# Patient Record
Sex: Male | Born: 1945
Health system: Southern US, Community
[De-identification: ages and names within clinical notes are randomized; demographics above are authoritative.]

## PROBLEM LIST (undated history)

## (undated) DIAGNOSIS — E119 Type 2 diabetes mellitus without complications: Secondary | ICD-10-CM

## (undated) DIAGNOSIS — I4891 Unspecified atrial fibrillation: Secondary | ICD-10-CM

## (undated) DIAGNOSIS — E785 Hyperlipidemia, unspecified: Secondary | ICD-10-CM

## (undated) DIAGNOSIS — D649 Anemia, unspecified: Secondary | ICD-10-CM

## (undated) DIAGNOSIS — I10 Essential (primary) hypertension: Secondary | ICD-10-CM

## (undated) DIAGNOSIS — E079 Disorder of thyroid, unspecified: Secondary | ICD-10-CM

## (undated) DIAGNOSIS — N529 Male erectile dysfunction, unspecified: Secondary | ICD-10-CM

## (undated) DIAGNOSIS — T300 Burn of unspecified body region, unspecified degree: Secondary | ICD-10-CM

## (undated) DIAGNOSIS — H3530 Unspecified macular degeneration: Secondary | ICD-10-CM

## (undated) HISTORY — DX: Male erectile dysfunction, unspecified: N52.9

## (undated) HISTORY — DX: Unspecified atrial fibrillation: I48.91

## (undated) HISTORY — DX: Anemia, unspecified: D64.9

## (undated) HISTORY — DX: Type 2 diabetes mellitus without complications: E11.9

## (undated) HISTORY — DX: Essential (primary) hypertension: I10

## (undated) HISTORY — DX: Disorder of thyroid, unspecified: E07.9

## (undated) HISTORY — DX: Burn of unspecified body region, unspecified degree: T30.0

## (undated) HISTORY — DX: Unspecified macular degeneration: H35.30

## (undated) HISTORY — DX: Hyperlipidemia, unspecified: E78.5

---
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
## 1972-05-29 HISTORY — PX: FOOT TUMOR EXCISION: SUR566

## 1973-05-29 HISTORY — PX: OTHER SURGICAL HISTORY: SHX169

## 2000-08-20 ENCOUNTER — Encounter: Admission: RE | Admit: 2000-08-20 | Discharge: 2000-11-18 | Payer: Self-pay | Admitting: Gastroenterology

## 2002-08-22 ENCOUNTER — Ambulatory Visit (HOSPITAL_COMMUNITY): Admission: RE | Admit: 2002-08-22 | Discharge: 2002-08-22 | Payer: Self-pay | Admitting: Gastroenterology

## 2012-03-29 DIAGNOSIS — E785 Hyperlipidemia, unspecified: Secondary | ICD-10-CM

## 2012-03-29 HISTORY — DX: Hyperlipidemia, unspecified: E78.5

## 2012-07-20 ENCOUNTER — Encounter: Payer: Self-pay | Admitting: *Deleted

## 2012-07-20 DIAGNOSIS — E039 Hypothyroidism, unspecified: Secondary | ICD-10-CM | POA: Insufficient documentation

## 2012-07-20 DIAGNOSIS — N529 Male erectile dysfunction, unspecified: Secondary | ICD-10-CM | POA: Insufficient documentation

## 2012-07-20 DIAGNOSIS — E1139 Type 2 diabetes mellitus with other diabetic ophthalmic complication: Secondary | ICD-10-CM | POA: Insufficient documentation

## 2012-07-20 DIAGNOSIS — E079 Disorder of thyroid, unspecified: Secondary | ICD-10-CM | POA: Insufficient documentation

## 2012-07-20 DIAGNOSIS — D649 Anemia, unspecified: Secondary | ICD-10-CM | POA: Insufficient documentation

## 2012-07-20 DIAGNOSIS — I10 Essential (primary) hypertension: Secondary | ICD-10-CM | POA: Insufficient documentation

## 2012-07-20 DIAGNOSIS — E782 Mixed hyperlipidemia: Secondary | ICD-10-CM | POA: Insufficient documentation

## 2012-08-16 ENCOUNTER — Other Ambulatory Visit: Payer: Self-pay | Admitting: Physician Assistant

## 2012-08-16 DIAGNOSIS — G894 Chronic pain syndrome: Secondary | ICD-10-CM

## 2012-08-16 NOTE — Telephone Encounter (Signed)
This is copy. I already addressed.

## 2012-08-16 NOTE — Telephone Encounter (Signed)
Approved. # 90 / 0 refill

## 2012-08-16 NOTE — Telephone Encounter (Signed)
Please call in medicine and call patient.

## 2012-08-16 NOTE — Telephone Encounter (Signed)
Need approval for controlled medication.

## 2012-08-19 NOTE — Telephone Encounter (Signed)
vicodins called in per MBD to pharmacy

## 2012-08-23 LAB — HM DIABETES EYE EXAM

## 2012-08-27 DIAGNOSIS — H3530 Unspecified macular degeneration: Secondary | ICD-10-CM

## 2012-08-27 HISTORY — DX: Unspecified macular degeneration: H35.30

## 2012-08-27 HISTORY — PX: EYE SURGERY: SHX253

## 2012-08-28 ENCOUNTER — Encounter: Payer: Self-pay | Admitting: Family Medicine

## 2012-08-28 DIAGNOSIS — H3530 Unspecified macular degeneration: Secondary | ICD-10-CM | POA: Insufficient documentation

## 2012-10-18 ENCOUNTER — Encounter: Payer: Self-pay | Admitting: Physician Assistant

## 2012-10-18 ENCOUNTER — Ambulatory Visit (INDEPENDENT_AMBULATORY_CARE_PROVIDER_SITE_OTHER): Payer: Medicare Other | Admitting: Physician Assistant

## 2012-10-18 ENCOUNTER — Other Ambulatory Visit: Payer: Self-pay | Admitting: Physician Assistant

## 2012-10-18 VITALS — BP 138/80 | HR 70 | Temp 97.8°F | Resp 20 | Ht 67.5 in | Wt 209.0 lb

## 2012-10-18 DIAGNOSIS — E1149 Type 2 diabetes mellitus with other diabetic neurological complication: Secondary | ICD-10-CM

## 2012-10-18 DIAGNOSIS — E079 Disorder of thyroid, unspecified: Secondary | ICD-10-CM

## 2012-10-18 DIAGNOSIS — E119 Type 2 diabetes mellitus without complications: Secondary | ICD-10-CM

## 2012-10-18 DIAGNOSIS — E1139 Type 2 diabetes mellitus with other diabetic ophthalmic complication: Secondary | ICD-10-CM

## 2012-10-18 DIAGNOSIS — E1142 Type 2 diabetes mellitus with diabetic polyneuropathy: Secondary | ICD-10-CM

## 2012-10-18 DIAGNOSIS — Z794 Long term (current) use of insulin: Secondary | ICD-10-CM

## 2012-10-18 DIAGNOSIS — I10 Essential (primary) hypertension: Secondary | ICD-10-CM

## 2012-10-18 DIAGNOSIS — E1165 Type 2 diabetes mellitus with hyperglycemia: Secondary | ICD-10-CM | POA: Insufficient documentation

## 2012-10-18 DIAGNOSIS — E785 Hyperlipidemia, unspecified: Secondary | ICD-10-CM

## 2012-10-18 DIAGNOSIS — E1140 Type 2 diabetes mellitus with diabetic neuropathy, unspecified: Secondary | ICD-10-CM

## 2012-10-18 DIAGNOSIS — D649 Anemia, unspecified: Secondary | ICD-10-CM

## 2012-10-18 DIAGNOSIS — H3530 Unspecified macular degeneration: Secondary | ICD-10-CM

## 2012-10-18 DIAGNOSIS — N529 Male erectile dysfunction, unspecified: Secondary | ICD-10-CM

## 2012-10-18 LAB — HEMOGLOBIN A1C, FINGERSTICK: Hgb A1C (fingerstick): 7.9 % — ABNORMAL HIGH (ref <5.7)

## 2012-10-18 NOTE — Telephone Encounter (Signed)
Med rf °

## 2012-10-18 NOTE — Telephone Encounter (Signed)
?   OK to Refill  
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
## 2012-10-18 NOTE — Telephone Encounter (Signed)
Approved # 90/ 0 refill

## 2012-10-19 ENCOUNTER — Other Ambulatory Visit: Payer: Self-pay | Admitting: Physician Assistant

## 2012-10-22 ENCOUNTER — Other Ambulatory Visit: Payer: Self-pay | Admitting: Physician Assistant

## 2012-10-22 ENCOUNTER — Encounter: Payer: Self-pay | Admitting: Physician Assistant

## 2012-10-22 NOTE — Telephone Encounter (Signed)
Hydrocodone was refilled on 10/18/12.  Pharmacy was called and refill verified.

## 2012-10-23 NOTE — Telephone Encounter (Signed)
Med was just refilled Friday 5/28.  That was verified by call to pharmacy. Pt has not picked up yet.  New request denied.

## 2012-11-26 ENCOUNTER — Other Ambulatory Visit: Payer: Self-pay | Admitting: Physician Assistant

## 2012-11-27 NOTE — Telephone Encounter (Signed)
?  ok to refill °

## 2012-11-27 NOTE — Telephone Encounter (Signed)
Approved. #90+ 0. 

## 2012-11-27 NOTE — Telephone Encounter (Signed)
Med phoned in °

## 2012-12-28 ENCOUNTER — Other Ambulatory Visit: Payer: Self-pay | Admitting: Physician Assistant

## 2012-12-30 ENCOUNTER — Other Ambulatory Visit: Payer: Self-pay | Admitting: Physician Assistant

## 2012-12-30 NOTE — Telephone Encounter (Signed)
Last refill 11/26/12.  Last OV 10/18/12  Need approval for controlled medication.

## 2012-12-30 NOTE — Telephone Encounter (Signed)
Rx called in 

## 2012-12-30 NOTE — Telephone Encounter (Signed)
Approved.  

## 2012-12-31 NOTE — Telephone Encounter (Addendum)
Duplicate request.  Denied.  Called in yesterday.  Verified with pharmacy

## 2013-01-17 ENCOUNTER — Other Ambulatory Visit: Payer: Self-pay | Admitting: Family Medicine

## 2013-01-17 NOTE — Telephone Encounter (Signed)
Medication refilled per protocol. 

## 2013-01-23 ENCOUNTER — Telehealth: Payer: Self-pay | Admitting: Physician Assistant

## 2013-01-23 ENCOUNTER — Ambulatory Visit: Payer: Medicare Other | Admitting: Physician Assistant

## 2013-01-23 ENCOUNTER — Other Ambulatory Visit: Payer: Self-pay | Admitting: Family Medicine

## 2013-01-23 ENCOUNTER — Encounter: Payer: Self-pay | Admitting: Family Medicine

## 2013-01-23 MED ORDER — METFORMIN HCL 500 MG PO TABS: 500.0000 mg | ORAL_TABLET | Freq: Once | ORAL | Status: DC

## 2013-01-23 MED ORDER — LISINOPRIL 10 MG PO TABS: 10.0000 mg | ORAL_TABLET | Freq: Every day | ORAL | Status: DC

## 2013-01-23 MED ORDER — LEVOTHYROXINE SODIUM 125 MCG PO TABS: 125.0000 ug | ORAL_TABLET | Freq: Every day | ORAL | Status: DC

## 2013-01-23 NOTE — Telephone Encounter (Signed)
Pt NS for appt today.  Medication refill for one time only.  Patient needs to be seen.  Letter sent for patient to call and schedule

## 2013-01-23 NOTE — Telephone Encounter (Signed)
Lisinopril 10 mg 1 QD #90 Synthroid 125 mcg 1 QD #90 Metoformin 500 mg 1 QD #90

## 2013-01-23 NOTE — Telephone Encounter (Signed)
Medication refill for one time only.  Patient needs to be seen.  Letter sent for patient to call and schedule 

## 2013-01-24 ENCOUNTER — Ambulatory Visit: Payer: Medicare Other | Admitting: Physician Assistant

## 2013-01-28 ENCOUNTER — Telehealth: Payer: Self-pay | Admitting: Physician Assistant

## 2013-01-28 ENCOUNTER — Other Ambulatory Visit: Payer: Self-pay | Admitting: Physician Assistant

## 2013-01-28 NOTE — Telephone Encounter (Signed)
Lisinopril Synthroid

## 2013-01-28 NOTE — Telephone Encounter (Signed)
Pt NTBS.  meds just refilled last week for one month.

## 2013-01-28 NOTE — Telephone Encounter (Signed)
Last refill 12/28/12/#90.  Last office visit 10/18/12  OK refill?

## 2013-01-28 NOTE — Telephone Encounter (Signed)
Refills done last week.  Pt needs to be seen.  Letter has been sent.

## 2013-01-29 NOTE — Telephone Encounter (Signed)
rx called in

## 2013-01-29 NOTE — Telephone Encounter (Signed)
Approved. #90+ 0. 

## 2013-03-07 ENCOUNTER — Telehealth: Payer: Self-pay | Admitting: *Deleted

## 2013-03-07 NOTE — Telephone Encounter (Signed)
RECEIVED FAX REQUEST FROM [facility]  FOR GABAPENTIN

## 2013-03-12 MED ORDER — GABAPENTIN 400 MG PO CAPS: 400.0000 mg | ORAL_CAPSULE | Freq: Two times a day (BID) | ORAL | Status: DC

## 2013-03-12 NOTE — Telephone Encounter (Signed)
Prescription can be refilled

## 2013-03-13 NOTE — Telephone Encounter (Signed)
Please refill.

## 2013-03-28 ENCOUNTER — Telehealth: Payer: Self-pay | Admitting: *Deleted

## 2013-03-28 NOTE — Telephone Encounter (Signed)
PT CALLED WANTING TO KNOW WHO SENT IN THE GABAPENTIN TO [facility]. TOLD PT FAX REQUEST CAME THRU AND DR REGAL APPROVED IT. PT STATES THAT WAS NOT SUPPOSED TO HAPPEN AND WILL CALL HIS PHARMACY/[facility] TO GET IT STRAIGHTEN OUT.

## 2013-04-21 ENCOUNTER — Ambulatory Visit (INDEPENDENT_AMBULATORY_CARE_PROVIDER_SITE_OTHER): Payer: Medicare Other | Admitting: Physician Assistant

## 2013-04-21 ENCOUNTER — Encounter: Payer: Self-pay | Admitting: Physician Assistant

## 2013-04-21 ENCOUNTER — Other Ambulatory Visit: Payer: Self-pay | Admitting: Family Medicine

## 2013-04-21 VITALS — BP 164/90 | HR 84 | Temp 98.7°F | Resp 18 | Wt 156.0 lb

## 2013-04-21 DIAGNOSIS — I10 Essential (primary) hypertension: Secondary | ICD-10-CM

## 2013-04-21 DIAGNOSIS — G894 Chronic pain syndrome: Secondary | ICD-10-CM

## 2013-04-21 DIAGNOSIS — E1142 Type 2 diabetes mellitus with diabetic polyneuropathy: Secondary | ICD-10-CM

## 2013-04-21 DIAGNOSIS — H3530 Unspecified macular degeneration: Secondary | ICD-10-CM

## 2013-04-21 DIAGNOSIS — E1140 Type 2 diabetes mellitus with diabetic neuropathy, unspecified: Secondary | ICD-10-CM

## 2013-04-21 DIAGNOSIS — E1139 Type 2 diabetes mellitus with other diabetic ophthalmic complication: Secondary | ICD-10-CM

## 2013-04-21 DIAGNOSIS — E079 Disorder of thyroid, unspecified: Secondary | ICD-10-CM

## 2013-04-21 DIAGNOSIS — N529 Male erectile dysfunction, unspecified: Secondary | ICD-10-CM

## 2013-04-21 DIAGNOSIS — D649 Anemia, unspecified: Secondary | ICD-10-CM

## 2013-04-21 DIAGNOSIS — E1149 Type 2 diabetes mellitus with other diabetic neurological complication: Secondary | ICD-10-CM

## 2013-04-21 DIAGNOSIS — Z794 Long term (current) use of insulin: Secondary | ICD-10-CM

## 2013-04-21 DIAGNOSIS — E785 Hyperlipidemia, unspecified: Secondary | ICD-10-CM

## 2013-04-21 DIAGNOSIS — E119 Type 2 diabetes mellitus without complications: Secondary | ICD-10-CM

## 2013-04-21 LAB — CBC WITH DIFFERENTIAL/PLATELET
Basophils Absolute: 0 10*3/uL (ref 0.0–0.1)
Eosinophils Relative: 2 % (ref 0–5)
Hemoglobin: 13.7 g/dL (ref 13.0–17.0)
Lymphocytes Relative: 14 % (ref 12–46)
Lymphs Abs: 1.5 10*3/uL (ref 0.7–4.0)
MCH: 30.9 pg (ref 26.0–34.0)
MCV: 90.5 fL (ref 78.0–100.0)
Monocytes Relative: 10 % (ref 3–12)
Neutrophils Relative %: 74 % (ref 43–77)
Platelets: 257 10*3/uL (ref 150–400)
RBC: 4.44 MIL/uL (ref 4.22–5.81)
WBC: 10.4 10*3/uL (ref 4.0–10.5)

## 2013-04-21 LAB — HEMOGLOBIN A1C
Hgb A1c MFr Bld: 8.9 % — ABNORMAL HIGH (ref <5.7)
Mean Plasma Glucose: 209 mg/dL — ABNORMAL HIGH (ref <117)

## 2013-04-21 MED ORDER — METFORMIN HCL 500 MG PO TABS: 500.0000 mg | ORAL_TABLET | Freq: Two times a day (BID) | ORAL | Status: DC

## 2013-04-21 MED ORDER — SIMVASTATIN 10 MG PO TABS: 10.0000 mg | ORAL_TABLET | Freq: Every day | ORAL | Status: DC

## 2013-04-21 MED ORDER — INSULIN ASPART 100 UNIT/ML ~~LOC~~ SOLN: 5.0000 [IU] | Freq: Three times a day (TID) | SUBCUTANEOUS | Status: DC

## 2013-04-21 MED ORDER — LEVOTHYROXINE SODIUM 125 MCG PO TABS: 125.0000 ug | ORAL_TABLET | Freq: Every day | ORAL | Status: DC

## 2013-04-21 MED ORDER — LISINOPRIL 10 MG PO TABS: 10.0000 mg | ORAL_TABLET | Freq: Every day | ORAL | Status: DC

## 2013-04-21 MED ORDER — HYDROCODONE-ACETAMINOPHEN 5-325 MG PO TABS: ORAL_TABLET | ORAL | Status: DC

## 2013-04-21 MED ORDER — GABAPENTIN 400 MG PO CAPS: 400.0000 mg | ORAL_CAPSULE | Freq: Two times a day (BID) | ORAL | Status: DC

## 2013-04-21 MED ORDER — INSULIN GLARGINE 100 UNIT/ML ~~LOC~~ SOLN: 25.0000 [IU] | Freq: Every day | SUBCUTANEOUS | Status: DC

## 2013-04-21 NOTE — Telephone Encounter (Signed)
90 day refills to mail order
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
## 2013-04-22 LAB — COMPLETE METABOLIC PANEL WITH GFR
ALT: 19 U/L (ref 0–53)
Albumin: 4.6 g/dL (ref 3.5–5.2)
BUN: 13 mg/dL (ref 6–23)
CO2: 25 mEq/L (ref 19–32)
Calcium: 9.9 mg/dL (ref 8.4–10.5)
Chloride: 99 mEq/L (ref 96–112)
Creat: 1.02 mg/dL (ref 0.50–1.35)
GFR, Est African American: 88 mL/min
Potassium: 4.2 mEq/L (ref 3.5–5.3)
Sodium: 136 mEq/L (ref 135–145)
Total Protein: 7.3 g/dL (ref 6.0–8.3)

## 2013-04-22 LAB — TSH: TSH: 3.565 u[IU]/mL (ref 0.350–4.500)

## 2013-04-22 LAB — LIPID PANEL: LDL Cholesterol: 69 mg/dL (ref 0–99)

## 2013-04-25 ENCOUNTER — Telehealth: Payer: Self-pay | Admitting: Family Medicine

## 2013-04-25 NOTE — Telephone Encounter (Signed)
Pt aware of labs and provider recommendations.   Also want to have his routine PSA done.  Lab notified to add to labs drawn.

## 2013-04-25 NOTE — Telephone Encounter (Signed)
PSA ws done in February.  Was normal then.  Pt made aware.

## 2013-04-25 NOTE — Telephone Encounter (Signed)
Message copied by Olena Mater on Fri Apr 25, 2013  2:47 PM ------      Message from: [name]      Created: Tue Apr 22, 2013  9:45 AM       Tell patient that A1c is high at 8.9.      He reports that he checks his blood sugar 4 or 5 times per day and adjust his insulin dose as needed.      Tell Him that the remainder of his labs are great.      Continue all current medications the same. Just try to increase his insulin to better control his sugar. ------

## 2013-05-02 ENCOUNTER — Ambulatory Visit (INDEPENDENT_AMBULATORY_CARE_PROVIDER_SITE_OTHER): Payer: Medicare Other | Admitting: Podiatry

## 2013-05-02 ENCOUNTER — Encounter: Payer: Self-pay | Admitting: Podiatry

## 2013-05-02 VITALS — BP 138/72 | HR 70 | Resp 16

## 2013-05-02 DIAGNOSIS — M7750 Other enthesopathy of unspecified foot: Secondary | ICD-10-CM

## 2013-05-02 MED ORDER — TRIAMCINOLONE ACETONIDE 10 MG/ML IJ SUSP
10.0000 mg | Freq: Once | INTRAMUSCULAR | Status: AC
  Administered 2013-05-02: 10 mg

## 2013-05-03 NOTE — Progress Notes (Signed)
Subjective:     Patient ID: Zachary Reed, male   DOB: 08-01-1945, 67 y.o.   MRN: [id number]  HPI patient points to the top of both feet stated they are becoming painful again. States oral medicine is not helping him   Review of Systems     Objective:   Physical Exam Tendinitis dorsal left foot with midtarsal joint arthritis and inflammation pressed    Assessment:     Explained tendinitis to the patient    Plan:     Injected the dorsal midtarsal joint both feet 3 mg Kenalog 5 mg Xylocaine Marcaine mixture and applied sterile dressing

## 2013-05-15 ENCOUNTER — Other Ambulatory Visit: Payer: Self-pay | Admitting: Physician Assistant

## 2013-05-15 NOTE — Telephone Encounter (Signed)
Medication refilled per protocol. 

## 2013-05-26 ENCOUNTER — Telehealth: Payer: Self-pay | Admitting: Physician Assistant

## 2013-05-26 DIAGNOSIS — G894 Chronic pain syndrome: Secondary | ICD-10-CM

## 2013-05-26 MED ORDER — HYDROCODONE-ACETAMINOPHEN 5-325 MG PO TABS: 1.0000 | ORAL_TABLET | Freq: Four times a day (QID) | ORAL | Status: DC | PRN

## 2013-05-26 MED ORDER — HYDROCODONE-ACETAMINOPHEN 5-325 MG PO TABS: ORAL_TABLET | ORAL | Status: DC

## 2013-05-26 NOTE — Telephone Encounter (Signed)
Approved. For 3 prescriptions. Each for #90 with 0 refills.  One to  fill now,  1 for "do not fill until 06/26/2013" 1 for " do not fill until 2/29/2015"

## 2013-05-26 NOTE — Telephone Encounter (Signed)
Last Rf 11/24  #90  Last OV 11/24  OK refill?

## 2013-05-26 NOTE — Telephone Encounter (Signed)
Rx's printed for provider signature.  Pt aware ready for pick up

## 2013-05-26 NOTE — Telephone Encounter (Signed)
Needs Rx for Hydrocodone #90

## 2013-06-10 ENCOUNTER — Telehealth: Payer: Self-pay | Admitting: Family Medicine

## 2013-06-10 DIAGNOSIS — I10 Essential (primary) hypertension: Secondary | ICD-10-CM

## 2013-06-10 MED ORDER — LISINOPRIL 10 MG PO TABS: 10.0000 mg | ORAL_TABLET | Freq: Every day | ORAL | Status: DC

## 2013-06-10 NOTE — Telephone Encounter (Signed)
Medication refilled per protocol. 

## 2013-06-11 ENCOUNTER — Other Ambulatory Visit: Payer: Self-pay | Admitting: Family Medicine

## 2013-06-11 MED ORDER — GLUCOSE BLOOD VI STRP: 1.0000 | ORAL_STRIP | Freq: Three times a day (TID) | Status: DC

## 2013-06-11 NOTE — Telephone Encounter (Signed)
Test strips to [facility] has new insurance

## 2013-07-23 ENCOUNTER — Ambulatory Visit (INDEPENDENT_AMBULATORY_CARE_PROVIDER_SITE_OTHER): Payer: Medicare HMO | Admitting: Physician Assistant

## 2013-07-23 ENCOUNTER — Encounter: Payer: Self-pay | Admitting: Physician Assistant

## 2013-07-23 VITALS — BP 114/56 | HR 68 | Temp 97.9°F | Resp 18 | Ht 68.0 in | Wt 159.0 lb

## 2013-07-23 DIAGNOSIS — E785 Hyperlipidemia, unspecified: Secondary | ICD-10-CM

## 2013-07-23 DIAGNOSIS — D649 Anemia, unspecified: Secondary | ICD-10-CM

## 2013-07-23 DIAGNOSIS — E1139 Type 2 diabetes mellitus with other diabetic ophthalmic complication: Secondary | ICD-10-CM

## 2013-07-23 DIAGNOSIS — N529 Male erectile dysfunction, unspecified: Secondary | ICD-10-CM

## 2013-07-23 DIAGNOSIS — E1149 Type 2 diabetes mellitus with other diabetic neurological complication: Secondary | ICD-10-CM

## 2013-07-23 DIAGNOSIS — E079 Disorder of thyroid, unspecified: Secondary | ICD-10-CM

## 2013-07-23 DIAGNOSIS — E1140 Type 2 diabetes mellitus with diabetic neuropathy, unspecified: Secondary | ICD-10-CM

## 2013-07-23 DIAGNOSIS — I10 Essential (primary) hypertension: Secondary | ICD-10-CM

## 2013-07-23 DIAGNOSIS — E1142 Type 2 diabetes mellitus with diabetic polyneuropathy: Secondary | ICD-10-CM

## 2013-07-23 DIAGNOSIS — E119 Type 2 diabetes mellitus without complications: Secondary | ICD-10-CM

## 2013-07-23 DIAGNOSIS — H3530 Unspecified macular degeneration: Secondary | ICD-10-CM

## 2013-07-23 DIAGNOSIS — Z794 Long term (current) use of insulin: Secondary | ICD-10-CM

## 2013-07-23 DIAGNOSIS — Z125 Encounter for screening for malignant neoplasm of prostate: Secondary | ICD-10-CM

## 2013-07-23 LAB — PSA, MEDICARE: PSA: 0.89 ng/mL (ref <=4.00)

## 2013-07-23 LAB — HEMOGLOBIN A1C
HEMOGLOBIN A1C: 8.3 % — AB (ref <5.7)
MEAN PLASMA GLUCOSE: 192 mg/dL — AB (ref <117)

## 2013-07-23 MED ORDER — GABAPENTIN 600 MG PO TABS: 600.0000 mg | ORAL_TABLET | Freq: Three times a day (TID) | ORAL | Status: DC

## 2013-07-24 LAB — MICROALBUMIN, URINE: Microalb, Ur: 0.5 mg/dL (ref 0.00–1.89)
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
## 2013-07-28 ENCOUNTER — Telehealth: Payer: Self-pay | Admitting: Family Medicine

## 2013-07-28 DIAGNOSIS — Z0100 Encounter for examination of eyes and vision without abnormal findings: Secondary | ICD-10-CM

## 2013-07-28 DIAGNOSIS — E119 Type 2 diabetes mellitus without complications: Secondary | ICD-10-CM

## 2013-07-28 NOTE — Telephone Encounter (Signed)
Message copied by Olena Mater on Mon Jul 28, 2013  1:41 PM ------      Message from: [name]      Created: Thu Jul 24, 2013  8:35 AM       A1C is still too high. He is on insulin. Including mealtime insulin. He just needs to do a better job of giving appropriate amount of insulin to keep the sugar controlled.      Other labs look good. ------

## 2013-08-01 LAB — HM DIABETES EYE EXAM

## 2013-08-01 NOTE — Telephone Encounter (Signed)
Spoke to patient about lab work. Reinforced need to be sure he is taking his insulin correctly every day.  Has 3 mth appt already made.  States has seen Eye doctor for eye exam but they told him to get referral and send to them.

## 2013-08-13 ENCOUNTER — Telehealth: Payer: Self-pay | Admitting: Physician Assistant

## 2013-08-13 DIAGNOSIS — E119 Type 2 diabetes mellitus without complications: Secondary | ICD-10-CM

## 2013-08-13 DIAGNOSIS — E079 Disorder of thyroid, unspecified: Secondary | ICD-10-CM

## 2013-08-13 DIAGNOSIS — G894 Chronic pain syndrome: Secondary | ICD-10-CM

## 2013-08-13 DIAGNOSIS — I10 Essential (primary) hypertension: Secondary | ICD-10-CM

## 2013-08-13 NOTE — Telephone Encounter (Signed)
Call back number is [phone] [facility]  Pt is needing  lisinopril (PRINIVIL,ZESTRIL) 10 MG tablet levothyroxine (SYNTHROID, LEVOTHROID) 125 MCG tablet metFORMIN (GLUCOPHAGE) 500 MG tablet 90 day supply on the 3 above  He also believes he needs a refill on HYDROcodone-acetaminophen (NORCO/VICODIN) 5-325 MG per tablet He is not sure if there are refills left at [facility]

## 2013-08-15 MED ORDER — LISINOPRIL 10 MG PO TABS: 10.0000 mg | ORAL_TABLET | Freq: Every day | ORAL | Status: DC

## 2013-08-15 MED ORDER — HYDROCODONE-ACETAMINOPHEN 5-325 MG PO TABS: 1.0000 | ORAL_TABLET | Freq: Four times a day (QID) | ORAL | Status: DC | PRN

## 2013-08-15 MED ORDER — LEVOTHYROXINE SODIUM 125 MCG PO TABS: 125.0000 ug | ORAL_TABLET | Freq: Every day | ORAL | Status: DC

## 2013-08-15 MED ORDER — METFORMIN HCL 500 MG PO TABS: 500.0000 mg | ORAL_TABLET | Freq: Two times a day (BID) | ORAL | Status: DC

## 2013-08-15 NOTE — Telephone Encounter (Signed)
Last Refill Hydrocodone 05/26/13.  Last OV 07/23/13.  Refill appropriate.  Rx printed for provider signature.  #90 + no refills

## 2013-09-03 ENCOUNTER — Encounter: Payer: Self-pay | Admitting: Family Medicine

## 2013-09-05 ENCOUNTER — Ambulatory Visit: Payer: Medicare Other | Admitting: Podiatry

## 2013-09-12 ENCOUNTER — Ambulatory Visit (INDEPENDENT_AMBULATORY_CARE_PROVIDER_SITE_OTHER): Payer: Medicare HMO | Admitting: Podiatry

## 2013-09-12 VITALS — Resp 16 | Ht 68.0 in | Wt 160.0 lb

## 2013-09-12 DIAGNOSIS — M779 Enthesopathy, unspecified: Secondary | ICD-10-CM

## 2013-09-12 MED ORDER — TRIAMCINOLONE ACETONIDE 10 MG/ML IJ SUSP
10.0000 mg | Freq: Once | INTRAMUSCULAR | Status: AC
  Administered 2013-09-12: 10 mg

## 2013-09-14 NOTE — Progress Notes (Signed)
Subjective:     Patient ID: Zachary Reed, male   DOB: Oct 07, 1945, 68 y.o.   MRN: [id number]  HPI patient presents stating I did okay for a few months but my pain has started to intensified over the last 4-6 weeks in my ankles   Review of Systems     Objective:   Physical Exam Neurovascular status intact with pain in the ankle region of both feet with reduced range of motion noted    Assessment:     Chronic arthritis with inflammatory capsulitis of the sinus tarsi both ankles    Plan:     Sterile preps done injected the ankles with 3 mg Kenalog 5 mg Xylocaine Marcaine mixture and instructed that this can be done on a periodic basis. Reappoint her recheck as needed

## 2013-09-23 ENCOUNTER — Telehealth: Payer: Self-pay | Admitting: Physician Assistant

## 2013-09-23 DIAGNOSIS — G894 Chronic pain syndrome: Secondary | ICD-10-CM

## 2013-09-23 NOTE — Telephone Encounter (Signed)
Approved. See if pt can keep up with future Rxes. If so, can go ahead and print 3 sepearte Rxes, each for # 90 + 0 for him to have for 4/28, 5/28, 6/28. He is on this chronic/long term.

## 2013-09-23 NOTE — Telephone Encounter (Signed)
Last RF 08/15/13 #90  Last OV 07/23/13  OK refill?

## 2013-09-23 NOTE — Telephone Encounter (Signed)
Patient would like rx for his norco please call him when ready at [phone]

## 2013-09-24 NOTE — Telephone Encounter (Signed)
Have left pt message to call back to see if he wants a one or three month refill.  If takes three month refill we will not replace if he looses

## 2013-09-25 MED ORDER — HYDROCODONE-ACETAMINOPHEN 5-325 MG PO TABS: 1.0000 | ORAL_TABLET | Freq: Four times a day (QID) | ORAL | Status: DC | PRN

## 2013-09-25 NOTE — Telephone Encounter (Signed)
Pt would like three month refills.  He said his pharmacy will hold.  Patient told once given three month refill presciptions, they WILL NOT be replaced if lost.

## 2013-09-25 NOTE — Telephone Encounter (Signed)
3 Rxes given for 4/30, 5/30, 11/25/13.

## 2013-10-23 ENCOUNTER — Encounter: Payer: Self-pay | Admitting: Physician Assistant

## 2013-10-23 ENCOUNTER — Ambulatory Visit (INDEPENDENT_AMBULATORY_CARE_PROVIDER_SITE_OTHER): Payer: Medicare HMO | Admitting: Physician Assistant

## 2013-10-23 VITALS — BP 126/76 | HR 76 | Temp 98.8°F | Resp 18 | Wt 155.0 lb

## 2013-10-23 DIAGNOSIS — Z794 Long term (current) use of insulin: Secondary | ICD-10-CM

## 2013-10-23 DIAGNOSIS — H3530 Unspecified macular degeneration: Secondary | ICD-10-CM

## 2013-10-23 DIAGNOSIS — E119 Type 2 diabetes mellitus without complications: Secondary | ICD-10-CM

## 2013-10-23 DIAGNOSIS — E1142 Type 2 diabetes mellitus with diabetic polyneuropathy: Secondary | ICD-10-CM

## 2013-10-23 DIAGNOSIS — E079 Disorder of thyroid, unspecified: Secondary | ICD-10-CM

## 2013-10-23 DIAGNOSIS — E1140 Type 2 diabetes mellitus with diabetic neuropathy, unspecified: Secondary | ICD-10-CM

## 2013-10-23 DIAGNOSIS — D649 Anemia, unspecified: Secondary | ICD-10-CM

## 2013-10-23 DIAGNOSIS — E785 Hyperlipidemia, unspecified: Secondary | ICD-10-CM

## 2013-10-23 DIAGNOSIS — G47419 Narcolepsy without cataplexy: Secondary | ICD-10-CM

## 2013-10-23 DIAGNOSIS — E1139 Type 2 diabetes mellitus with other diabetic ophthalmic complication: Secondary | ICD-10-CM

## 2013-10-23 DIAGNOSIS — I10 Essential (primary) hypertension: Secondary | ICD-10-CM

## 2013-10-23 DIAGNOSIS — E1149 Type 2 diabetes mellitus with other diabetic neurological complication: Secondary | ICD-10-CM

## 2013-10-23 DIAGNOSIS — N529 Male erectile dysfunction, unspecified: Secondary | ICD-10-CM

## 2013-10-23 LAB — COMPLETE METABOLIC PANEL WITH GFR
ALT: 15 U/L (ref 0–53)
AST: 19 U/L (ref 0–37)
Albumin: 4.7 g/dL (ref 3.5–5.2)
Alkaline Phosphatase: 77 U/L (ref 39–117)
BILIRUBIN TOTAL: 0.5 mg/dL (ref 0.2–1.2)
BUN: 18 mg/dL (ref 6–23)
CO2: 26 meq/L (ref 19–32)
CREATININE: 0.99 mg/dL (ref 0.50–1.35)
Calcium: 9.6 mg/dL (ref 8.4–10.5)
Chloride: 103 mEq/L (ref 96–112)
GFR, EST NON AFRICAN AMERICAN: 78 mL/min
GLUCOSE: 143 mg/dL — AB (ref 70–99)
Potassium: 4.6 mEq/L (ref 3.5–5.3)
Sodium: 137 mEq/L (ref 135–145)
Total Protein: 7.1 g/dL (ref 6.0–8.3)

## 2013-10-23 LAB — LIPID PANEL
CHOLESTEROL: 183 mg/dL (ref 0–200)
HDL: 48 mg/dL (ref >39)
LDL Cholesterol: 122 mg/dL — ABNORMAL HIGH (ref 0–99)
TRIGLYCERIDES: 65 mg/dL (ref <150)
Total CHOL/HDL Ratio: 3.8 Ratio
VLDL: 13 mg/dL (ref 0–40)
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
## 2013-10-24 ENCOUNTER — Encounter: Payer: Self-pay | Admitting: Family Medicine

## 2013-10-24 LAB — HEMOGLOBIN A1C
HEMOGLOBIN A1C: 8.3 % — AB (ref <5.7)
MEAN PLASMA GLUCOSE: 192 mg/dL — AB (ref <117)

## 2013-10-24 LAB — TSH: TSH: 1.51 u[IU]/mL (ref 0.350–4.500)

## 2013-10-29 ENCOUNTER — Telehealth: Payer: Self-pay | Admitting: *Deleted

## 2013-10-29 NOTE — Telephone Encounter (Signed)
Message copied by Maureen Chatters on Wed Oct 29, 2013  4:47 PM ------      Message from: Lenore Manner      Created: Wed Oct 29, 2013  9:48 AM      Regarding: referral      Contact: [phone]       Pt states he received a call for what i believe is for a sleep study and on the vmail he states he does not want it and wants the number to call and cancel the apt  ------

## 2013-10-29 NOTE — Telephone Encounter (Signed)
LM on pt vm to r/t call pt does not want referral to sleep center.

## 2013-11-07 ENCOUNTER — Institutional Professional Consult (permissible substitution): Payer: Medicare HMO | Admitting: Neurology

## 2013-12-23 ENCOUNTER — Telehealth: Payer: Self-pay | Admitting: *Deleted

## 2013-12-23 DIAGNOSIS — G894 Chronic pain syndrome: Secondary | ICD-10-CM

## 2013-12-23 NOTE — Telephone Encounter (Signed)
Refill on HYDROcodone-acetaminophen (NORCO/VICODIN) 5-325 MG per tablet pt states he is wanting a 3 mos supply, says you do it all the time.

## 2013-12-24 MED ORDER — HYDROCODONE-ACETAMINOPHEN 5-325 MG PO TABS: 1.0000 | ORAL_TABLET | Freq: Four times a day (QID) | ORAL | Status: DC | PRN

## 2013-12-24 NOTE — Telephone Encounter (Signed)
Can print him one prescription for #90+0 refills. Tell him that he will be due for office visit one month from now to follow up his diabetes etc. Schedule office visit for one month from now-- we can print further prescriptions at that time of that visit.

## 2013-12-24 NOTE — Telephone Encounter (Signed)
Left patient message that RX ready for pick up.  Note left on RX that appt will be needed before next refill due.  Fasting early morning.

## 2014-01-02 ENCOUNTER — Ambulatory Visit (INDEPENDENT_AMBULATORY_CARE_PROVIDER_SITE_OTHER): Payer: Medicare HMO | Admitting: Podiatry

## 2014-01-02 VITALS — BP 159/98 | HR 66 | Resp 16

## 2014-01-02 DIAGNOSIS — M779 Enthesopathy, unspecified: Secondary | ICD-10-CM

## 2014-01-02 MED ORDER — TRIAMCINOLONE ACETONIDE 10 MG/ML IJ SUSP
10.0000 mg | Freq: Once | INTRAMUSCULAR | Status: AC
  Administered 2014-01-02: 10 mg

## 2014-01-02 NOTE — Progress Notes (Signed)
Subjective:     Patient ID: Zachary Reed, male   DOB: 1945-07-26, 68 y.o.   MRN: [id number]  HPI patient presents stating both of my ankles are bothering me. Lasted for about 5 months and no pain   Review of Systems     Objective:   Physical Exam Neurovascular status intact with muscle strength adequate and discomfort in the sinus tarsi of both feet    Assessment:     Sinus tarsitis bilateral    Plan:     Sinus tarsi injections bilateral 3 mg Kenalog 5 mg I can Marcaine mixture

## 2014-01-22 ENCOUNTER — Ambulatory Visit (INDEPENDENT_AMBULATORY_CARE_PROVIDER_SITE_OTHER): Payer: Medicare HMO | Admitting: Physician Assistant

## 2014-01-22 ENCOUNTER — Encounter: Payer: Self-pay | Admitting: Physician Assistant

## 2014-01-22 VITALS — BP 128/66 | HR 68 | Temp 98.4°F | Resp 14 | Ht 68.0 in | Wt 161.0 lb

## 2014-01-22 DIAGNOSIS — E1140 Type 2 diabetes mellitus with diabetic neuropathy, unspecified: Secondary | ICD-10-CM

## 2014-01-22 DIAGNOSIS — E119 Type 2 diabetes mellitus without complications: Secondary | ICD-10-CM

## 2014-01-22 DIAGNOSIS — I10 Essential (primary) hypertension: Secondary | ICD-10-CM

## 2014-01-22 DIAGNOSIS — E1142 Type 2 diabetes mellitus with diabetic polyneuropathy: Secondary | ICD-10-CM

## 2014-01-22 DIAGNOSIS — N529 Male erectile dysfunction, unspecified: Secondary | ICD-10-CM

## 2014-01-22 DIAGNOSIS — E1149 Type 2 diabetes mellitus with other diabetic neurological complication: Secondary | ICD-10-CM

## 2014-01-22 DIAGNOSIS — H3530 Unspecified macular degeneration: Secondary | ICD-10-CM

## 2014-01-22 DIAGNOSIS — Z794 Long term (current) use of insulin: Secondary | ICD-10-CM

## 2014-01-22 DIAGNOSIS — E785 Hyperlipidemia, unspecified: Secondary | ICD-10-CM

## 2014-01-22 DIAGNOSIS — E079 Disorder of thyroid, unspecified: Secondary | ICD-10-CM

## 2014-01-22 DIAGNOSIS — N521 Erectile dysfunction due to diseases classified elsewhere: Secondary | ICD-10-CM
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
## 2014-01-26 ENCOUNTER — Telehealth: Payer: Self-pay | Admitting: Physician Assistant

## 2014-01-26 DIAGNOSIS — G894 Chronic pain syndrome: Secondary | ICD-10-CM

## 2014-01-26 MED ORDER — HYDROCODONE-ACETAMINOPHEN 5-325 MG PO TABS: 1.0000 | ORAL_TABLET | Freq: Four times a day (QID) | ORAL | Status: DC | PRN

## 2014-01-26 NOTE — Telephone Encounter (Signed)
Patient is calling to see if we could refill his hydrocodone 90 day supply if possible  Best number to call is [phone]

## 2014-01-26 NOTE — Telephone Encounter (Signed)
All 3 scripts printed, ready for provider signature and pt aware to pick up

## 2014-01-26 NOTE — Telephone Encounter (Signed)
Ok to refill 

## 2014-01-26 NOTE — Telephone Encounter (Signed)
May print 3 prescriptions. One of them to be filled now. On one of them  write "do not fill until 02/25/2014" On one of them write " do not fill until 03/27/14"

## 2014-02-17 ENCOUNTER — Other Ambulatory Visit: Payer: Self-pay | Admitting: Physician Assistant

## 2014-02-17 NOTE — Telephone Encounter (Signed)
Medication refilled per protocol. 

## 2014-02-24 ENCOUNTER — Other Ambulatory Visit: Payer: Self-pay | Admitting: Physician Assistant

## 2014-02-24 NOTE — Telephone Encounter (Signed)
Medication refilled per protocol. 

## 2014-03-25 ENCOUNTER — Telehealth: Payer: Self-pay | Admitting: Family Medicine

## 2014-03-25 NOTE — Telephone Encounter (Signed)
Received fax from [facility] service in reference to this patient and need for him to be on a Statin Therapy due to his other health issues.  Called patient home.  Spoke to wife.  According to her he is taking Simvastatin THEN says he did stop taking because of adverse reactions. (feeling weak and tired, which resolved after he stop taking) Tells me he said he discussed this with provider at last [facility].  Per last visit, Simvastatin was being taken with no adverse reactions noted!!!  On last Lipid panel lab, result note was to be sure patient was taking Simvastatin as directed.  Has follow up appt 04/30/14.

## 2014-04-13 ENCOUNTER — Telehealth: Payer: Self-pay | Admitting: Family Medicine

## 2014-04-13 NOTE — Telephone Encounter (Signed)
Sildenafil is viagra---this comes and 25, 50, and 100 mg . Therefore this request really doesn't make much sense. Also the quantity as stated in the request doesn't make much sense either. Usually only prescribed around #6 pills of Viagra at a time.

## 2014-04-13 NOTE — Telephone Encounter (Signed)
Rec'd request from [facility].  States patient requesting new prescription for Sildenafil 20 mg  #50.  Please advise?

## 2014-04-14 NOTE — Telephone Encounter (Signed)
Faxed pharmacy back their paper request with " "NO" Have patient contact office"

## 2014-04-21 ENCOUNTER — Other Ambulatory Visit: Payer: Self-pay | Admitting: Physician Assistant

## 2014-04-21 DIAGNOSIS — Z794 Long term (current) use of insulin: Principal | ICD-10-CM

## 2014-04-21 DIAGNOSIS — E119 Type 2 diabetes mellitus without complications: Secondary | ICD-10-CM

## 2014-04-22 NOTE — Telephone Encounter (Signed)
Medication refilled per protocol. 

## 2014-04-30 ENCOUNTER — Ambulatory Visit (INDEPENDENT_AMBULATORY_CARE_PROVIDER_SITE_OTHER): Payer: Commercial Managed Care - HMO | Admitting: Physician Assistant

## 2014-04-30 ENCOUNTER — Encounter: Payer: Self-pay | Admitting: Physician Assistant

## 2014-04-30 VITALS — BP 146/86 | HR 76 | Temp 98.1°F | Resp 18 | Wt 161.0 lb

## 2014-04-30 DIAGNOSIS — E119 Type 2 diabetes mellitus without complications: Secondary | ICD-10-CM

## 2014-04-30 DIAGNOSIS — E079 Disorder of thyroid, unspecified: Secondary | ICD-10-CM

## 2014-04-30 DIAGNOSIS — G8929 Other chronic pain: Secondary | ICD-10-CM

## 2014-04-30 DIAGNOSIS — E1140 Type 2 diabetes mellitus with diabetic neuropathy, unspecified: Secondary | ICD-10-CM

## 2014-04-30 DIAGNOSIS — H3530 Unspecified macular degeneration: Secondary | ICD-10-CM

## 2014-04-30 DIAGNOSIS — I10 Essential (primary) hypertension: Secondary | ICD-10-CM

## 2014-04-30 DIAGNOSIS — Z794 Long term (current) use of insulin: Secondary | ICD-10-CM

## 2014-04-30 DIAGNOSIS — G894 Chronic pain syndrome: Secondary | ICD-10-CM

## 2014-04-30 DIAGNOSIS — D509 Iron deficiency anemia, unspecified: Secondary | ICD-10-CM

## 2014-04-30 DIAGNOSIS — E785 Hyperlipidemia, unspecified: Secondary | ICD-10-CM

## 2014-04-30 DIAGNOSIS — N521 Erectile dysfunction due to diseases classified elsewhere: Secondary | ICD-10-CM

## 2014-04-30 DIAGNOSIS — M25519 Pain in unspecified shoulder: Secondary | ICD-10-CM

## 2014-04-30 MED ORDER — HYDROCODONE-ACETAMINOPHEN 5-325 MG PO TABS: 1.0000 | ORAL_TABLET | Freq: Four times a day (QID) | ORAL | Status: DC | PRN

## 2014-05-01 ENCOUNTER — Telehealth: Payer: Self-pay | Admitting: Physician Assistant

## 2014-05-01 LAB — LIPID PANEL
CHOLESTEROL: 226 mg/dL — AB (ref 0–200)
HDL: 60 mg/dL (ref >39)
LDL Cholesterol: 151 mg/dL — ABNORMAL HIGH (ref 0–99)
Total CHOL/HDL Ratio: 3.8 Ratio
Triglycerides: 77 mg/dL (ref <150)
VLDL: 15 mg/dL (ref 0–40)

## 2014-05-01 LAB — COMPLETE METABOLIC PANEL WITH GFR
ALT: 15 U/L (ref 0–53)
AST: 26 U/L (ref 0–37)
Albumin: 4.6 g/dL (ref 3.5–5.2)
Alkaline Phosphatase: 55 U/L (ref 39–117)
BUN: 14 mg/dL (ref 6–23)
CALCIUM: 10.2 mg/dL (ref 8.4–10.5)
CHLORIDE: 97 meq/L (ref 96–112)
CO2: 23 meq/L (ref 19–32)
Creat: 1.25 mg/dL (ref 0.50–1.35)
GFR, EST AFRICAN AMERICAN: 68 mL/min
GFR, EST NON AFRICAN AMERICAN: 59 mL/min — AB
GLUCOSE: 145 mg/dL — AB (ref 70–99)
Potassium: 4.8 mEq/L (ref 3.5–5.3)
SODIUM: 134 meq/L — AB (ref 135–145)
Total Bilirubin: 0.7 mg/dL (ref 0.2–1.2)
Total Protein: 7.6 g/dL (ref 6.0–8.3)

## 2014-05-01 LAB — HEMOGLOBIN A1C
Hgb A1c MFr Bld: 7.9 % — ABNORMAL HIGH (ref <5.7)
MEAN PLASMA GLUCOSE: 180 mg/dL — AB (ref <117)

## 2014-05-01 LAB — TSH: TSH: 50.412 u[IU]/mL — AB (ref 0.350–4.500)

## 2014-05-01 MED ORDER — INSULIN GLARGINE 100 UNIT/ML ~~LOC~~ SOLN: 12.0000 [IU] | Freq: Every day | SUBCUTANEOUS | Status: DC

## 2014-05-01 NOTE — Telephone Encounter (Signed)
Refill sent.

## 2014-05-01 NOTE — Telephone Encounter (Signed)
[facility] [address] [place]  Patient would like rx for lantus 12 units, he says we have not prescribed this for him, his daughter always gives it to him  [phone]
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
## 2014-05-06 LAB — OPIATES/OPIOIDS (LC/MS-MS)
CODEINE URINE: NEGATIVE ng/mL (ref <50)
HYDROMORPHONE: 219 ng/mL — AB (ref <50)
Hydrocodone: 126 ng/mL — AB (ref <50)
Morphine Urine: NEGATIVE ng/mL (ref <50)
NORHYDROCODONE, UR: 430 ng/mL — AB (ref <50)
Noroxycodone, Ur: NEGATIVE ng/mL (ref <50)
Oxycodone, ur: NEGATIVE ng/mL (ref <50)
Oxymorphone: NEGATIVE ng/mL (ref <50)

## 2014-05-07 LAB — PRESCRIPTION MONITORING PROFILE (13 PANEL)
AMPHETAMINE/METH: NEGATIVE ng/mL
Barbiturate Screen, Urine: NEGATIVE ng/mL
Benzodiazepine Screen, Urine: NEGATIVE ng/mL
Buprenorphine, Urine: NEGATIVE ng/mL
CANNABINOID SCRN UR: NEGATIVE ng/mL
COCAINE METABOLITES: NEGATIVE ng/mL
CREATININE, URINE: 88.11 mg/dL (ref >20.0)
FENTANYL URINE: NEGATIVE ng/mL
METHADONE SCREEN, URINE: NEGATIVE ng/mL
Meperidine, Ur: NEGATIVE ng/mL
Nitrites, Initial: NEGATIVE ug/mL
OXYCODONE SCRN UR: NEGATIVE ng/mL
Propoxyphene: NEGATIVE ng/mL
TRAMADOL UR: NEGATIVE ng/mL
pH, Initial: 6.5 pH (ref 4.5–8.9)

## 2014-05-18 ENCOUNTER — Other Ambulatory Visit: Payer: Self-pay | Admitting: Physician Assistant

## 2014-05-18 NOTE — Telephone Encounter (Signed)
Medication refilled per protocol. 

## 2014-06-18 ENCOUNTER — Telehealth: Payer: Self-pay | Admitting: Podiatry

## 2014-06-18 NOTE — Telephone Encounter (Signed)
Pt called asking if we received his referral and would like an appt next week. He will be home tonight after 7.

## 2014-06-22 ENCOUNTER — Telehealth: Payer: Self-pay | Admitting: *Deleted

## 2014-06-22 NOTE — Telephone Encounter (Signed)
Received fax from [facility]) with authorization number [id number] for Dr. Ila Mcgill, DPM   Dx: M77.9- Enthesopathy, unspecified  Number of visits: 6  Start Date: 06/18/2014 expires on 12/15/2014  Paper copy of this authorization has been faxed to [facility]

## 2014-07-16 ENCOUNTER — Other Ambulatory Visit: Payer: Self-pay | Admitting: Physician Assistant

## 2014-07-16 NOTE — Telephone Encounter (Signed)
Medication refilled per protocol. 

## 2014-07-30 ENCOUNTER — Telehealth: Payer: Self-pay | Admitting: Physician Assistant

## 2014-07-30 DIAGNOSIS — Z794 Long term (current) use of insulin: Principal | ICD-10-CM

## 2014-07-30 DIAGNOSIS — E119 Type 2 diabetes mellitus without complications: Secondary | ICD-10-CM

## 2014-07-30 NOTE — Telephone Encounter (Signed)
[facility] is needing a refill on simvastatin (ZOCOR) 10 MG tablet  PT is needing test strips for meter Acucheck

## 2014-07-31 ENCOUNTER — Ambulatory Visit (INDEPENDENT_AMBULATORY_CARE_PROVIDER_SITE_OTHER): Payer: Commercial Managed Care - HMO | Admitting: Podiatry

## 2014-07-31 VITALS — BP 169/83 | HR 72 | Resp 16

## 2014-07-31 DIAGNOSIS — M779 Enthesopathy, unspecified: Secondary | ICD-10-CM

## 2014-07-31 MED ORDER — TRIAMCINOLONE ACETONIDE 10 MG/ML IJ SUSP
10.0000 mg | Freq: Once | INTRAMUSCULAR | Status: AC
  Administered 2014-07-31: 10 mg

## 2014-07-31 MED ORDER — SIMVASTATIN 10 MG PO TABS: 10.0000 mg | ORAL_TABLET | Freq: Every day | ORAL | Status: DC

## 2014-07-31 MED ORDER — GLUCOSE BLOOD VI STRP: ORAL_STRIP | Status: DC

## 2014-07-31 NOTE — Telephone Encounter (Signed)
Medication refilled per protocol. 

## 2014-08-01 NOTE — Progress Notes (Signed)
Subjective:     Patient ID: Zachary Reed., male   DOB: May 27, 1946, 69 y.o.   MRN: [id number]  HPI patient presents with pain in the sinus tarsi of both feet and now presents with a lot of pain around the fifth MPJ right that makes it hard to walk. States he did well for about 4 months with reoccurrence of pain   Review of Systems     Objective:   Physical Exam Inflammatory sinus tarsitis bilateral with inflammation around the fifth MPJ right that still present    Assessment:     Reviewed condition and discussed treatment options    Plan:     He has opted for injections and I injected the sinus tarsi bilateral 3 mg Kenalog 5 mg Xylocaine and the right fifth MPJ 2 mg Xylocaine 2 mg dexamethasone Kenalog combination and advised on returning when symptoms really occur

## 2014-08-06 ENCOUNTER — Encounter: Payer: Self-pay | Admitting: Physician Assistant

## 2014-08-06 ENCOUNTER — Ambulatory Visit: Payer: Medicare HMO | Admitting: Physician Assistant

## 2014-08-06 ENCOUNTER — Ambulatory Visit (INDEPENDENT_AMBULATORY_CARE_PROVIDER_SITE_OTHER): Payer: Commercial Managed Care - HMO | Admitting: Physician Assistant

## 2014-08-06 VITALS — BP 126/60 | HR 88 | Temp 98.2°F | Resp 18 | Wt 153.0 lb

## 2014-08-06 DIAGNOSIS — Z794 Long term (current) use of insulin: Secondary | ICD-10-CM | POA: Diagnosis not present

## 2014-08-06 DIAGNOSIS — E119 Type 2 diabetes mellitus without complications: Secondary | ICD-10-CM | POA: Diagnosis not present

## 2014-08-06 DIAGNOSIS — G894 Chronic pain syndrome: Secondary | ICD-10-CM | POA: Diagnosis not present

## 2014-08-06 DIAGNOSIS — E1140 Type 2 diabetes mellitus with diabetic neuropathy, unspecified: Secondary | ICD-10-CM

## 2014-08-06 DIAGNOSIS — E1139 Type 2 diabetes mellitus with other diabetic ophthalmic complication: Secondary | ICD-10-CM | POA: Diagnosis not present

## 2014-08-06 DIAGNOSIS — I10 Essential (primary) hypertension: Secondary | ICD-10-CM

## 2014-08-06 DIAGNOSIS — D509 Iron deficiency anemia, unspecified: Secondary | ICD-10-CM | POA: Diagnosis not present

## 2014-08-06 DIAGNOSIS — N521 Erectile dysfunction due to diseases classified elsewhere: Secondary | ICD-10-CM

## 2014-08-06 DIAGNOSIS — E785 Hyperlipidemia, unspecified: Secondary | ICD-10-CM

## 2014-08-06 DIAGNOSIS — H3530 Unspecified macular degeneration: Secondary | ICD-10-CM

## 2014-08-06 DIAGNOSIS — Z125 Encounter for screening for malignant neoplasm of prostate: Secondary | ICD-10-CM | POA: Diagnosis not present

## 2014-08-06 DIAGNOSIS — E079 Disorder of thyroid, unspecified: Secondary | ICD-10-CM

## 2014-08-06 MED ORDER — HYDROCODONE-ACETAMINOPHEN 5-325 MG PO TABS: 1.0000 | ORAL_TABLET | Freq: Four times a day (QID) | ORAL | Status: DC | PRN
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
## 2014-08-07 LAB — COMPLETE METABOLIC PANEL WITH GFR
ALBUMIN: 4.5 g/dL (ref 3.5–5.2)
ALT: 15 U/L (ref 0–53)
AST: 19 U/L (ref 0–37)
Alkaline Phosphatase: 53 U/L (ref 39–117)
BUN: 18 mg/dL (ref 6–23)
CALCIUM: 9.6 mg/dL (ref 8.4–10.5)
CHLORIDE: 101 meq/L (ref 96–112)
CO2: 27 mEq/L (ref 19–32)
Creat: 1.08 mg/dL (ref 0.50–1.35)
GFR, Est African American: 81 mL/min
GFR, Est Non African American: 70 mL/min
Glucose, Bld: 84 mg/dL (ref 70–99)
POTASSIUM: 4.8 meq/L (ref 3.5–5.3)
SODIUM: 135 meq/L (ref 135–145)
TOTAL PROTEIN: 7 g/dL (ref 6.0–8.3)
Total Bilirubin: 0.5 mg/dL (ref 0.2–1.2)

## 2014-08-07 LAB — TSH: TSH: 4.582 u[IU]/mL — AB (ref 0.350–4.500)

## 2014-08-07 LAB — HEMOGLOBIN A1C
Hgb A1c MFr Bld: 7.6 % — ABNORMAL HIGH (ref <5.7)
Mean Plasma Glucose: 171 mg/dL — ABNORMAL HIGH (ref <117)

## 2014-08-07 LAB — PSA, MEDICARE: PSA: 1.33 ng/mL (ref <=4.00)

## 2014-08-07 LAB — MICROALBUMIN, URINE: Microalb, Ur: 0.4 mg/dL (ref <2.0)

## 2014-08-27 DIAGNOSIS — E119 Type 2 diabetes mellitus without complications: Secondary | ICD-10-CM | POA: Diagnosis not present

## 2014-08-27 DIAGNOSIS — Z961 Presence of intraocular lens: Secondary | ICD-10-CM | POA: Diagnosis not present

## 2014-08-27 DIAGNOSIS — H43813 Vitreous degeneration, bilateral: Secondary | ICD-10-CM | POA: Diagnosis not present

## 2014-08-27 LAB — HM DIABETES EYE EXAM

## 2014-09-18 ENCOUNTER — Encounter: Payer: Self-pay | Admitting: Family Medicine

## 2014-09-28 ENCOUNTER — Other Ambulatory Visit: Payer: Self-pay | Admitting: Physician Assistant

## 2014-10-07 ENCOUNTER — Telehealth: Payer: Self-pay | Admitting: Family Medicine

## 2014-10-07 MED ORDER — ACCU-CHEK SOFT TOUCH LANCETS MISC: 1.0000 | Freq: Three times a day (TID) | Status: DC

## 2014-10-07 NOTE — Telephone Encounter (Signed)
Lancets refilled 

## 2014-10-12 ENCOUNTER — Telehealth: Payer: Self-pay | Admitting: Family Medicine

## 2014-10-12 DIAGNOSIS — IMO0002 Reserved for concepts with insufficient information to code with codable children: Secondary | ICD-10-CM

## 2014-10-12 DIAGNOSIS — E1165 Type 2 diabetes mellitus with hyperglycemia: Secondary | ICD-10-CM

## 2014-10-12 MED ORDER — ACCU-CHEK SOFT TOUCH LANCETS MISC: 1.0000 | Freq: Three times a day (TID) | Status: DC

## 2014-10-12 NOTE — Telephone Encounter (Signed)
Lancets refilled 

## 2014-10-14 ENCOUNTER — Telehealth: Payer: Self-pay | Admitting: Family Medicine

## 2014-10-14 MED ORDER — ACCU-CHEK AVIVA DEVI: Status: AC

## 2014-10-14 MED ORDER — ACCU-CHEK AVIVA VI SOLN: Status: DC

## 2014-10-14 NOTE — Telephone Encounter (Signed)
Order for new glucometer and test solution sent

## 2014-11-16 ENCOUNTER — Encounter: Payer: Self-pay | Admitting: Physician Assistant

## 2014-11-16 ENCOUNTER — Ambulatory Visit (INDEPENDENT_AMBULATORY_CARE_PROVIDER_SITE_OTHER): Payer: Commercial Managed Care - HMO | Admitting: Physician Assistant

## 2014-11-16 VITALS — BP 110/60 | HR 68 | Temp 98.4°F | Resp 18 | Wt 155.0 lb

## 2014-11-16 DIAGNOSIS — Z794 Long term (current) use of insulin: Secondary | ICD-10-CM

## 2014-11-16 DIAGNOSIS — G894 Chronic pain syndrome: Secondary | ICD-10-CM | POA: Diagnosis not present

## 2014-11-16 DIAGNOSIS — E1139 Type 2 diabetes mellitus with other diabetic ophthalmic complication: Secondary | ICD-10-CM

## 2014-11-16 DIAGNOSIS — E785 Hyperlipidemia, unspecified: Secondary | ICD-10-CM

## 2014-11-16 DIAGNOSIS — D509 Iron deficiency anemia, unspecified: Secondary | ICD-10-CM

## 2014-11-16 DIAGNOSIS — E1140 Type 2 diabetes mellitus with diabetic neuropathy, unspecified: Secondary | ICD-10-CM

## 2014-11-16 DIAGNOSIS — Z125 Encounter for screening for malignant neoplasm of prostate: Secondary | ICD-10-CM

## 2014-11-16 DIAGNOSIS — E119 Type 2 diabetes mellitus without complications: Secondary | ICD-10-CM

## 2014-11-16 DIAGNOSIS — H3530 Unspecified macular degeneration: Secondary | ICD-10-CM

## 2014-11-16 DIAGNOSIS — I10 Essential (primary) hypertension: Secondary | ICD-10-CM | POA: Diagnosis not present

## 2014-11-16 DIAGNOSIS — N521 Erectile dysfunction due to diseases classified elsewhere: Secondary | ICD-10-CM | POA: Diagnosis not present

## 2014-11-16 DIAGNOSIS — E079 Disorder of thyroid, unspecified: Secondary | ICD-10-CM | POA: Diagnosis not present

## 2014-11-16 MED ORDER — HYDROCODONE-ACETAMINOPHEN 5-325 MG PO TABS: 1.0000 | ORAL_TABLET | Freq: Four times a day (QID) | ORAL | Status: DC | PRN

## 2014-11-16 MED ORDER — LISINOPRIL 10 MG PO TABS: 10.0000 mg | ORAL_TABLET | Freq: Every day | ORAL | Status: DC

## 2014-11-16 MED ORDER — LEVOTHYROXINE SODIUM 125 MCG PO TABS: 125.0000 ug | ORAL_TABLET | Freq: Every day | ORAL | Status: DC
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
## 2014-11-17 ENCOUNTER — Encounter: Payer: Self-pay | Admitting: Family Medicine

## 2014-11-17 LAB — COMPLETE METABOLIC PANEL WITH GFR
ALBUMIN: 4.4 g/dL (ref 3.5–5.2)
ALK PHOS: 43 U/L (ref 39–117)
ALT: 14 U/L (ref 0–53)
AST: 18 U/L (ref 0–37)
BUN: 16 mg/dL (ref 6–23)
CO2: 26 mEq/L (ref 19–32)
Calcium: 9.5 mg/dL (ref 8.4–10.5)
Chloride: 100 mEq/L (ref 96–112)
Creat: 0.99 mg/dL (ref 0.50–1.35)
GFR, Est African American: 89 mL/min
GFR, Est Non African American: 77 mL/min
Glucose, Bld: 117 mg/dL — ABNORMAL HIGH (ref 70–99)
POTASSIUM: 4.8 meq/L (ref 3.5–5.3)
Sodium: 139 mEq/L (ref 135–145)
Total Bilirubin: 0.7 mg/dL (ref 0.2–1.2)
Total Protein: 6.8 g/dL (ref 6.0–8.3)

## 2014-11-17 LAB — LIPID PANEL
CHOL/HDL RATIO: 3.3 ratio
CHOLESTEROL: 147 mg/dL (ref 0–200)
HDL: 45 mg/dL (ref >=40)
LDL Cholesterol: 81 mg/dL (ref 0–99)
Triglycerides: 106 mg/dL (ref <150)
VLDL: 21 mg/dL (ref 0–40)

## 2014-11-17 LAB — HEMOGLOBIN A1C
Hgb A1c MFr Bld: 7.6 % — ABNORMAL HIGH (ref <5.7)
Mean Plasma Glucose: 171 mg/dL — ABNORMAL HIGH (ref <117)

## 2014-11-17 LAB — TSH: TSH: 1.825 u[IU]/mL (ref 0.350–4.500)

## 2014-11-23 ENCOUNTER — Encounter: Payer: Self-pay | Admitting: Family Medicine

## 2014-12-09 ENCOUNTER — Other Ambulatory Visit: Payer: Self-pay | Admitting: Physician Assistant

## 2014-12-09 NOTE — Telephone Encounter (Signed)
Refill appropriate and filled per protocol. 

## 2015-01-27 ENCOUNTER — Other Ambulatory Visit: Payer: Self-pay | Admitting: Physician Assistant

## 2015-01-28 NOTE — Telephone Encounter (Signed)
Refill appropriate and filled per protocol. 

## 2015-02-17 ENCOUNTER — Ambulatory Visit (INDEPENDENT_AMBULATORY_CARE_PROVIDER_SITE_OTHER): Payer: Commercial Managed Care - HMO | Admitting: Physician Assistant

## 2015-02-17 ENCOUNTER — Encounter: Payer: Self-pay | Admitting: Physician Assistant

## 2015-02-17 VITALS — BP 118/64 | HR 68 | Temp 98.8°F | Resp 16 | Wt 159.0 lb

## 2015-02-17 DIAGNOSIS — D509 Iron deficiency anemia, unspecified: Secondary | ICD-10-CM | POA: Diagnosis not present

## 2015-02-17 DIAGNOSIS — Z794 Long term (current) use of insulin: Secondary | ICD-10-CM

## 2015-02-17 DIAGNOSIS — E785 Hyperlipidemia, unspecified: Secondary | ICD-10-CM | POA: Diagnosis not present

## 2015-02-17 DIAGNOSIS — R202 Paresthesia of skin: Secondary | ICD-10-CM | POA: Diagnosis not present

## 2015-02-17 DIAGNOSIS — H3530 Unspecified macular degeneration: Secondary | ICD-10-CM

## 2015-02-17 DIAGNOSIS — S62102S Fracture of unspecified carpal bone, left wrist, sequela: Secondary | ICD-10-CM | POA: Diagnosis not present

## 2015-02-17 DIAGNOSIS — N521 Erectile dysfunction due to diseases classified elsewhere: Secondary | ICD-10-CM

## 2015-02-17 DIAGNOSIS — I10 Essential (primary) hypertension: Secondary | ICD-10-CM | POA: Diagnosis not present

## 2015-02-17 DIAGNOSIS — E119 Type 2 diabetes mellitus without complications: Secondary | ICD-10-CM

## 2015-02-17 DIAGNOSIS — E079 Disorder of thyroid, unspecified: Secondary | ICD-10-CM | POA: Diagnosis not present

## 2015-02-17 DIAGNOSIS — E1139 Type 2 diabetes mellitus with other diabetic ophthalmic complication: Secondary | ICD-10-CM | POA: Diagnosis not present

## 2015-02-17 DIAGNOSIS — E1140 Type 2 diabetes mellitus with diabetic neuropathy, unspecified: Secondary | ICD-10-CM

## 2015-02-17 DIAGNOSIS — G894 Chronic pain syndrome: Secondary | ICD-10-CM

## 2015-02-17 DIAGNOSIS — Z125 Encounter for screening for malignant neoplasm of prostate: Secondary | ICD-10-CM

## 2015-02-17 LAB — HEMOGLOBIN A1C, FINGERSTICK: Hgb A1C (fingerstick): 7.6 % — ABNORMAL HIGH (ref <5.7)

## 2015-02-17 MED ORDER — HYDROCODONE-ACETAMINOPHEN 5-325 MG PO TABS: 1.0000 | ORAL_TABLET | Freq: Four times a day (QID) | ORAL | Status: DC | PRN
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
## 2015-02-22 ENCOUNTER — Telehealth: Payer: Self-pay | Admitting: *Deleted

## 2015-02-22 NOTE — Telephone Encounter (Signed)
Submitted [employer] referral thru acuity connect for authorization to Dr. Iran Planas, MD ortho with authorization number [id number]  Requesting provider:Hettinger Pickard,MD  Treating provider: Avon Gully  Number of visits:6  Start Date: 04/12/15  End Date:10/09/15  Dx:R20.2-presence fo skin      S62.102S-Fracture of unspecified carpal bone, left wrist,sequela

## 2015-03-26 ENCOUNTER — Other Ambulatory Visit: Payer: Self-pay | Admitting: Physician Assistant

## 2015-03-26 NOTE — Telephone Encounter (Signed)
Refill appropriate and filled per protocol. 

## 2015-04-12 DIAGNOSIS — M19032 Primary osteoarthritis, left wrist: Secondary | ICD-10-CM | POA: Diagnosis not present

## 2015-04-12 DIAGNOSIS — G5602 Carpal tunnel syndrome, left upper limb: Secondary | ICD-10-CM | POA: Diagnosis not present

## 2015-04-12 DIAGNOSIS — M720 Palmar fascial fibromatosis [Dupuytren]: Secondary | ICD-10-CM | POA: Diagnosis not present

## 2015-04-14 ENCOUNTER — Other Ambulatory Visit: Payer: Self-pay | Admitting: Family Medicine

## 2015-04-15 NOTE — Telephone Encounter (Signed)
Medication refilled per protocol. 

## 2015-04-19 DIAGNOSIS — M19032 Primary osteoarthritis, left wrist: Secondary | ICD-10-CM | POA: Diagnosis not present

## 2015-04-26 DIAGNOSIS — G5602 Carpal tunnel syndrome, left upper limb: Secondary | ICD-10-CM | POA: Diagnosis not present

## 2015-04-26 DIAGNOSIS — M720 Palmar fascial fibromatosis [Dupuytren]: Secondary | ICD-10-CM | POA: Diagnosis not present

## 2015-04-26 DIAGNOSIS — M19032 Primary osteoarthritis, left wrist: Secondary | ICD-10-CM | POA: Diagnosis not present

## 2015-05-17 DIAGNOSIS — G5602 Carpal tunnel syndrome, left upper limb: Secondary | ICD-10-CM | POA: Diagnosis not present

## 2015-05-19 ENCOUNTER — Ambulatory Visit (INDEPENDENT_AMBULATORY_CARE_PROVIDER_SITE_OTHER): Payer: Commercial Managed Care - HMO | Admitting: Physician Assistant

## 2015-05-19 ENCOUNTER — Encounter: Payer: Self-pay | Admitting: Physician Assistant

## 2015-05-19 VITALS — BP 130/74 | HR 88 | Temp 98.9°F | Resp 18 | Wt 155.0 lb

## 2015-05-19 DIAGNOSIS — E119 Type 2 diabetes mellitus without complications: Secondary | ICD-10-CM

## 2015-05-19 DIAGNOSIS — E1140 Type 2 diabetes mellitus with diabetic neuropathy, unspecified: Secondary | ICD-10-CM

## 2015-05-19 DIAGNOSIS — Z794 Long term (current) use of insulin: Secondary | ICD-10-CM

## 2015-05-19 DIAGNOSIS — E079 Disorder of thyroid, unspecified: Secondary | ICD-10-CM | POA: Diagnosis not present

## 2015-05-19 DIAGNOSIS — Z125 Encounter for screening for malignant neoplasm of prostate: Secondary | ICD-10-CM

## 2015-05-19 DIAGNOSIS — G894 Chronic pain syndrome: Secondary | ICD-10-CM | POA: Diagnosis not present

## 2015-05-19 DIAGNOSIS — E785 Hyperlipidemia, unspecified: Secondary | ICD-10-CM

## 2015-05-19 DIAGNOSIS — E1139 Type 2 diabetes mellitus with other diabetic ophthalmic complication: Secondary | ICD-10-CM

## 2015-05-19 DIAGNOSIS — N521 Erectile dysfunction due to diseases classified elsewhere: Secondary | ICD-10-CM

## 2015-05-19 DIAGNOSIS — H3530 Unspecified macular degeneration: Secondary | ICD-10-CM

## 2015-05-19 DIAGNOSIS — I10 Essential (primary) hypertension: Secondary | ICD-10-CM

## 2015-05-19 MED ORDER — HYDROCODONE-ACETAMINOPHEN 5-325 MG PO TABS: 1.0000 | ORAL_TABLET | Freq: Four times a day (QID) | ORAL | Status: DC | PRN
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
## 2015-05-20 ENCOUNTER — Encounter: Payer: Self-pay | Admitting: Family Medicine

## 2015-05-20 LAB — COMPLETE METABOLIC PANEL WITH GFR
ALBUMIN: 4.4 g/dL (ref 3.6–5.1)
ALK PHOS: 43 U/L (ref 40–115)
ALT: 12 U/L (ref 9–46)
AST: 15 U/L (ref 10–35)
BUN: 11 mg/dL (ref 7–25)
CALCIUM: 9.2 mg/dL (ref 8.6–10.3)
CHLORIDE: 101 mmol/L (ref 98–110)
CO2: 25 mmol/L (ref 20–31)
Creat: 0.88 mg/dL (ref 0.70–1.25)
GFR, Est African American: >89 mL/min (ref >=60)
GFR, Est Non African American: 88 mL/min (ref >=60)
Glucose, Bld: 157 mg/dL — ABNORMAL HIGH (ref 70–99)
POTASSIUM: 4.2 mmol/L (ref 3.5–5.3)
Sodium: 135 mmol/L (ref 135–146)
Total Bilirubin: 0.5 mg/dL (ref 0.2–1.2)
Total Protein: 6.7 g/dL (ref 6.1–8.1)

## 2015-05-20 LAB — TSH: TSH: 2.583 u[IU]/mL (ref 0.350–4.500)

## 2015-05-20 LAB — HEMOGLOBIN A1C
Hgb A1c MFr Bld: 7.8 % — ABNORMAL HIGH (ref <5.7)
MEAN PLASMA GLUCOSE: 177 mg/dL — AB (ref <117)

## 2015-06-09 ENCOUNTER — Other Ambulatory Visit: Payer: Self-pay | Admitting: Physician Assistant

## 2015-06-10 NOTE — Telephone Encounter (Signed)
Medication refilled per protocol. 

## 2015-06-25 ENCOUNTER — Other Ambulatory Visit: Payer: Self-pay | Admitting: Family Medicine

## 2015-08-18 ENCOUNTER — Encounter: Payer: Self-pay | Admitting: Physician Assistant

## 2015-08-18 ENCOUNTER — Ambulatory Visit (INDEPENDENT_AMBULATORY_CARE_PROVIDER_SITE_OTHER): Payer: Commercial Managed Care - HMO | Admitting: Physician Assistant

## 2015-08-18 VITALS — BP 124/60 | HR 68 | Temp 98.2°F | Resp 18 | Wt 152.0 lb

## 2015-08-18 DIAGNOSIS — E785 Hyperlipidemia, unspecified: Secondary | ICD-10-CM

## 2015-08-18 DIAGNOSIS — E079 Disorder of thyroid, unspecified: Secondary | ICD-10-CM

## 2015-08-18 DIAGNOSIS — Z125 Encounter for screening for malignant neoplasm of prostate: Secondary | ICD-10-CM

## 2015-08-18 DIAGNOSIS — I10 Essential (primary) hypertension: Secondary | ICD-10-CM

## 2015-08-18 DIAGNOSIS — E119 Type 2 diabetes mellitus without complications: Secondary | ICD-10-CM

## 2015-08-18 DIAGNOSIS — H3530 Unspecified macular degeneration: Secondary | ICD-10-CM | POA: Diagnosis not present

## 2015-08-18 DIAGNOSIS — E1140 Type 2 diabetes mellitus with diabetic neuropathy, unspecified: Secondary | ICD-10-CM | POA: Diagnosis not present

## 2015-08-18 DIAGNOSIS — Z794 Long term (current) use of insulin: Secondary | ICD-10-CM

## 2015-08-18 DIAGNOSIS — G894 Chronic pain syndrome: Secondary | ICD-10-CM | POA: Diagnosis not present

## 2015-08-18 DIAGNOSIS — E1139 Type 2 diabetes mellitus with other diabetic ophthalmic complication: Secondary | ICD-10-CM | POA: Diagnosis not present

## 2015-08-18 LAB — HEMOGLOBIN A1C, FINGERSTICK: Hgb A1C (fingerstick): 8 % — ABNORMAL HIGH (ref <5.7)

## 2015-08-18 MED ORDER — HYDROCODONE-ACETAMINOPHEN 5-325 MG PO TABS: 1.0000 | ORAL_TABLET | Freq: Four times a day (QID) | ORAL | Status: DC | PRN
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
## 2015-08-19 LAB — MICROALBUMIN, URINE: Microalb, Ur: 0.7 mg/dL

## 2015-09-16 ENCOUNTER — Other Ambulatory Visit: Payer: Self-pay | Admitting: Physician Assistant

## 2015-09-16 NOTE — Telephone Encounter (Signed)
Ok. Approved.

## 2015-09-16 NOTE — Telephone Encounter (Signed)
Don't see on med list and he wants "26"  OK?

## 2015-09-17 NOTE — Telephone Encounter (Signed)
Refill sent.

## 2015-11-02 ENCOUNTER — Telehealth: Payer: Self-pay | Admitting: Physician Assistant

## 2015-11-02 NOTE — Telephone Encounter (Signed)
No samples of Novolog are available.   Samples of Humalog given with same directions.

## 2015-11-02 NOTE — Telephone Encounter (Signed)
Patients daughter calling to ask if yaxiel can get samples of his insulin, she says its humalog, but says she dosent know why he cant take novalog  Please call tabatha at  [phone]

## 2015-11-03 ENCOUNTER — Other Ambulatory Visit: Payer: Self-pay | Admitting: Physician Assistant

## 2015-11-04 NOTE — Telephone Encounter (Signed)
Refill appropriate and filled per protocol. 

## 2015-11-05 ENCOUNTER — Other Ambulatory Visit: Payer: Self-pay | Admitting: Physician Assistant

## 2015-11-05 MED ORDER — SIMVASTATIN 10 MG PO TABS: ORAL_TABLET | ORAL | Status: DC

## 2015-11-05 MED ORDER — LISINOPRIL 10 MG PO TABS: 10.0000 mg | ORAL_TABLET | Freq: Every day | ORAL | Status: DC

## 2015-11-05 MED ORDER — LEVOTHYROXINE SODIUM 125 MCG PO TABS: 125.0000 ug | ORAL_TABLET | Freq: Every day | ORAL | Status: DC

## 2015-11-05 NOTE — Telephone Encounter (Signed)
Refill appropriate and filled per protocol. 

## 2015-11-05 NOTE — Telephone Encounter (Signed)
Pt is calling for a refill of Simvastatin 10 mg Lisinopril 10 mg and Levothyroxine 125 mcg [facility]

## 2015-11-05 NOTE — Telephone Encounter (Signed)
Prescription sent to pharmacy.

## 2015-11-18 ENCOUNTER — Ambulatory Visit (INDEPENDENT_AMBULATORY_CARE_PROVIDER_SITE_OTHER): Payer: Commercial Managed Care - HMO | Admitting: Physician Assistant

## 2015-11-18 ENCOUNTER — Encounter: Payer: Self-pay | Admitting: Physician Assistant

## 2015-11-18 VITALS — BP 122/70 | HR 80 | Temp 98.9°F | Resp 16 | Ht 68.0 in | Wt 152.0 lb

## 2015-11-18 DIAGNOSIS — E785 Hyperlipidemia, unspecified: Secondary | ICD-10-CM | POA: Diagnosis not present

## 2015-11-18 DIAGNOSIS — E1139 Type 2 diabetes mellitus with other diabetic ophthalmic complication: Secondary | ICD-10-CM

## 2015-11-18 DIAGNOSIS — E079 Disorder of thyroid, unspecified: Secondary | ICD-10-CM | POA: Diagnosis not present

## 2015-11-18 DIAGNOSIS — E1140 Type 2 diabetes mellitus with diabetic neuropathy, unspecified: Secondary | ICD-10-CM

## 2015-11-18 DIAGNOSIS — G894 Chronic pain syndrome: Secondary | ICD-10-CM

## 2015-11-18 DIAGNOSIS — Z794 Long term (current) use of insulin: Secondary | ICD-10-CM

## 2015-11-18 DIAGNOSIS — I10 Essential (primary) hypertension: Secondary | ICD-10-CM

## 2015-11-18 DIAGNOSIS — Z125 Encounter for screening for malignant neoplasm of prostate: Secondary | ICD-10-CM | POA: Diagnosis not present

## 2015-11-18 DIAGNOSIS — H3530 Unspecified macular degeneration: Secondary | ICD-10-CM

## 2015-11-18 DIAGNOSIS — E119 Type 2 diabetes mellitus without complications: Secondary | ICD-10-CM

## 2015-11-18 DIAGNOSIS — I4891 Unspecified atrial fibrillation: Secondary | ICD-10-CM

## 2015-11-18 MED ORDER — HYDROCODONE-ACETAMINOPHEN 5-325 MG PO TABS: 1.0000 | ORAL_TABLET | Freq: Four times a day (QID) | ORAL | Status: DC | PRN

## 2015-11-18 MED ORDER — DILTIAZEM HCL ER COATED BEADS 120 MG PO CP24: 120.0000 mg | ORAL_CAPSULE | Freq: Every day | ORAL | Status: DC

## 2015-11-18 MED ORDER — HYDROCODONE-ACETAMINOPHEN 5-325 MG PO TABS
1.0000 | ORAL_TABLET | Freq: Four times a day (QID) | ORAL | Status: DC | PRN
Start: 2015-11-18 — End: 2016-02-24

## 2015-11-18 MED ORDER — APIXABAN 5 MG PO TABS: 5.0000 mg | ORAL_TABLET | Freq: Two times a day (BID) | ORAL | Status: DC
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
## 2015-11-19 LAB — COMPLETE METABOLIC PANEL WITH GFR
ALT: 15 U/L (ref 9–46)
AST: 21 U/L (ref 10–35)
Albumin: 4.2 g/dL (ref 3.6–5.1)
Alkaline Phosphatase: 59 U/L (ref 40–115)
BILIRUBIN TOTAL: 0.5 mg/dL (ref 0.2–1.2)
BUN: 15 mg/dL (ref 7–25)
CHLORIDE: 103 mmol/L (ref 98–110)
CO2: 24 mmol/L (ref 20–31)
CREATININE: 1.05 mg/dL (ref 0.70–1.18)
Calcium: 9.4 mg/dL (ref 8.6–10.3)
GFR, Est African American: 83 mL/min (ref >=60)
GFR, Est Non African American: 72 mL/min (ref >=60)
GLUCOSE: 112 mg/dL — AB (ref 70–99)
Potassium: 4.3 mmol/L (ref 3.5–5.3)
SODIUM: 138 mmol/L (ref 135–146)
TOTAL PROTEIN: 6.7 g/dL (ref 6.1–8.1)

## 2015-11-19 LAB — PSA, MEDICARE: PSA: 1.38 ng/mL (ref <=4.00)

## 2015-11-19 LAB — HEMOGLOBIN A1C
Hgb A1c MFr Bld: 7.5 % — ABNORMAL HIGH (ref <5.7)
Mean Plasma Glucose: 169 mg/dL

## 2015-11-19 LAB — TSH: TSH: 0.12 mIU/L — ABNORMAL LOW (ref 0.40–4.50)

## 2015-11-24 ENCOUNTER — Other Ambulatory Visit: Payer: Self-pay | Admitting: Physician Assistant

## 2015-11-25 NOTE — Telephone Encounter (Signed)
Medication refilled per protocol. 

## 2015-11-26 ENCOUNTER — Other Ambulatory Visit: Payer: Self-pay | Admitting: Physician Assistant

## 2015-11-26 NOTE — Telephone Encounter (Signed)
Medication refilled per protocol. 

## 2015-12-06 ENCOUNTER — Telehealth: Payer: Self-pay | Admitting: Physician Assistant

## 2015-12-06 MED ORDER — INSULIN LISPRO 100 UNIT/ML ~~LOC~~ SOLN: SUBCUTANEOUS | Status: DC

## 2015-12-06 NOTE — Telephone Encounter (Addendum)
Pt is requesting a refill of Humalog 100 un. [place]

## 2015-12-06 NOTE — Telephone Encounter (Signed)
rx to mail order

## 2015-12-24 ENCOUNTER — Ambulatory Visit (INDEPENDENT_AMBULATORY_CARE_PROVIDER_SITE_OTHER): Payer: Commercial Managed Care - HMO | Admitting: Cardiovascular Disease

## 2015-12-24 ENCOUNTER — Other Ambulatory Visit
Admission: RE | Admit: 2015-12-24 | Discharge: 2015-12-24 | Disposition: A | Discharge status: Home / Self Care | Payer: Commercial Managed Care - HMO | Source: Ambulatory Visit | Attending: Cardiovascular Disease | Admitting: Cardiovascular Disease

## 2015-12-24 ENCOUNTER — Encounter: Payer: Self-pay | Admitting: Cardiovascular Disease

## 2015-12-24 VITALS — BP 158/68 | HR 71 | Ht 68.0 in | Wt 150.5 lb

## 2015-12-24 DIAGNOSIS — I10 Essential (primary) hypertension: Secondary | ICD-10-CM

## 2015-12-24 DIAGNOSIS — E785 Hyperlipidemia, unspecified: Secondary | ICD-10-CM | POA: Diagnosis not present

## 2015-12-24 DIAGNOSIS — I4891 Unspecified atrial fibrillation: Secondary | ICD-10-CM | POA: Diagnosis not present

## 2015-12-24 LAB — CBC
HEMATOCRIT: 39.6 % — AB (ref 40.0–52.0)
Hemoglobin: 13.4 g/dL (ref 13.0–18.0)
MCH: 31.3 pg (ref 26.0–34.0)
MCHC: 34 g/dL (ref 32.0–36.0)
MCV: 92 fL (ref 80.0–100.0)
PLATELETS: 225 10*3/uL (ref 150–440)
RBC: 4.3 MIL/uL — ABNORMAL LOW (ref 4.40–5.90)
RDW: 13.8 % (ref 11.5–14.5)
WBC: 8.7 10*3/uL (ref 3.8–10.6)

## 2015-12-24 LAB — BASIC METABOLIC PANEL
Anion gap: 9 (ref 5–15)
BUN: 14 mg/dL (ref 6–20)
CO2: 26 mmol/L (ref 22–32)
Calcium: 9.3 mg/dL (ref 8.9–10.3)
Chloride: 101 mmol/L (ref 101–111)
Creatinine, Ser: 0.91 mg/dL (ref 0.61–1.24)
GFR calc Af Amer: >60 mL/min (ref >60)
GLUCOSE: 131 mg/dL — AB (ref 65–99)
POTASSIUM: 4.1 mmol/L (ref 3.5–5.1)
Sodium: 136 mmol/L (ref 135–145)

## 2015-12-24 LAB — TSH: TSH: 0.541 u[IU]/mL (ref 0.350–4.500)
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
## 2015-12-24 NOTE — Patient Instructions (Signed)
Medication Instructions:  Your physician has recommended you make the following change in your medication:  STOP taking aspirin   Labwork: CBC, BMET, TSH at the [facility]  Testing/Procedures: Your physician has requested that you have an echocardiogram. Echocardiography is a painless test that uses sound waves to create images of your heart. It provides your doctor with information about the size and shape of your heart and how well your heart's chambers and valves are working. This procedure takes approximately one hour. There are no restrictions for this procedure.    Follow-Up: Your physician wants you to follow-up in: 3 months with Dr. Fletcher Anon.  You will receive a reminder letter in the mail two months in advance. If you don't receive a letter, please call our office to schedule the follow-up appointment.   Any Other Special Instructions Will Be Listed Below (If Applicable).     If you need a refill on your cardiac medications before your next appointment, please call your pharmacy.

## 2016-01-03 ENCOUNTER — Ambulatory Visit (INDEPENDENT_AMBULATORY_CARE_PROVIDER_SITE_OTHER): Payer: Commercial Managed Care - HMO

## 2016-01-03 ENCOUNTER — Other Ambulatory Visit: Payer: Self-pay

## 2016-01-03 DIAGNOSIS — I4891 Unspecified atrial fibrillation: Secondary | ICD-10-CM

## 2016-01-04 LAB — ECHOCARDIOGRAM COMPLETE
AVPHT: 410 ms
CHL CUP DOP CALC LVOT VTI: 22 cm
CHL CUP STROKE VOLUME: 55 mL
EERAT: 5.84
EWDT: 275 ms
FS: 43 % (ref 28–44)
IVS/LV PW RATIO, ED: 0.82
LA diam end sys: 35 mm
LADIAMINDEX: 1.93 cm/m2
LASIZE: 35 mm
LAVOL: 69.7 mL
LAVOLA4C: 59.4 mL
LAVOLIN: 38.5 mL/m2
LV E/e'average: 5.84
LV PW d: 9.17 mm — AB (ref 0.6–1.1)
LV dias vol: 81 mL (ref 62–150)
LV e' LATERAL: 20.9 cm/s
LVDIAVOLIN: 45 mL/m2
LVEEMED: 5.84
LVOT area: 2.84 cm2
LVOTD: 19 mm
LVOTPV: 90.4 cm/s
LVOTSV: 62 mL
LVSYSVOL: 27 mL (ref 21–61)
LVSYSVOLIN: 15 mL/m2
MV Dec: 275
MV Peak grad: 6 mmHg
MV pk A vel: 58.8 m/s
MV pk E vel: 122 m/s
RV TAPSE: 21.3 mm
RV sys press: 23 mmHg
Reg peak vel: 225 cm/s
Simpson's disk: 67
TDI e' lateral: 20.9
TDI e' medial: 10.4
TR max vel: 225 cm/s

## 2016-01-07 ENCOUNTER — Other Ambulatory Visit: Payer: Self-pay | Admitting: Physician Assistant

## 2016-01-07 NOTE — Telephone Encounter (Signed)
Medication refilled per protocol. 

## 2016-01-11 ENCOUNTER — Other Ambulatory Visit: Payer: Self-pay | Admitting: Family Medicine

## 2016-01-11 NOTE — Telephone Encounter (Signed)
Refill appropriate and filled per protocol. 

## 2016-01-12 ENCOUNTER — Other Ambulatory Visit: Payer: Self-pay | Admitting: Family Medicine

## 2016-01-12 DIAGNOSIS — I4891 Unspecified atrial fibrillation: Secondary | ICD-10-CM

## 2016-01-12 MED ORDER — DILTIAZEM HCL ER COATED BEADS 120 MG PO CP24: 120.0000 mg | ORAL_CAPSULE | Freq: Every day | ORAL | 1 refills | Status: DC

## 2016-01-12 NOTE — Telephone Encounter (Signed)
Medication refilled per protocol. 

## 2016-01-14 ENCOUNTER — Telehealth: Payer: Self-pay

## 2016-01-14 NOTE — Telephone Encounter (Signed)
Pt's wife left VM stating she has questions about patient's medication. Best # [phone]

## 2016-01-24 NOTE — Telephone Encounter (Signed)
Pt just wants to let us know that his insurance does not cover Humalog and the next refill we need to change insulin to Novolog.

## 2016-02-10 ENCOUNTER — Telehealth: Payer: Self-pay

## 2016-02-10 ENCOUNTER — Other Ambulatory Visit: Payer: Self-pay | Admitting: Physician Assistant

## 2016-02-10 NOTE — Telephone Encounter (Signed)
Pt req refill on Humalog

## 2016-02-11 NOTE — Telephone Encounter (Signed)
RX filled per protocol 

## 2016-02-24 ENCOUNTER — Ambulatory Visit (INDEPENDENT_AMBULATORY_CARE_PROVIDER_SITE_OTHER): Payer: Commercial Managed Care - HMO | Admitting: Physician Assistant

## 2016-02-24 ENCOUNTER — Encounter: Payer: Self-pay | Admitting: Physician Assistant

## 2016-02-24 VITALS — BP 144/88 | HR 68 | Temp 98.3°F | Resp 20 | Wt 152.0 lb

## 2016-02-24 DIAGNOSIS — E785 Hyperlipidemia, unspecified: Secondary | ICD-10-CM | POA: Diagnosis not present

## 2016-02-24 DIAGNOSIS — E079 Disorder of thyroid, unspecified: Secondary | ICD-10-CM

## 2016-02-24 DIAGNOSIS — I4891 Unspecified atrial fibrillation: Secondary | ICD-10-CM | POA: Diagnosis not present

## 2016-02-24 DIAGNOSIS — I10 Essential (primary) hypertension: Secondary | ICD-10-CM | POA: Diagnosis not present

## 2016-02-24 DIAGNOSIS — E0840 Diabetes mellitus due to underlying condition with diabetic neuropathy, unspecified: Secondary | ICD-10-CM | POA: Diagnosis not present

## 2016-02-24 DIAGNOSIS — E08311 Diabetes mellitus due to underlying condition with unspecified diabetic retinopathy with macular edema: Secondary | ICD-10-CM | POA: Diagnosis not present

## 2016-02-24 DIAGNOSIS — E119 Type 2 diabetes mellitus without complications: Secondary | ICD-10-CM | POA: Diagnosis not present

## 2016-02-24 DIAGNOSIS — G894 Chronic pain syndrome: Secondary | ICD-10-CM | POA: Diagnosis not present

## 2016-02-24 DIAGNOSIS — Z794 Long term (current) use of insulin: Secondary | ICD-10-CM | POA: Diagnosis not present

## 2016-02-24 DIAGNOSIS — Z125 Encounter for screening for malignant neoplasm of prostate: Secondary | ICD-10-CM | POA: Diagnosis not present

## 2016-02-24 LAB — HEMOGLOBIN A1C, FINGERSTICK: HEMOGLOBIN A1C, FINGERSTICK: 7.3 % — AB (ref <5.7)

## 2016-02-24 MED ORDER — HYDROCODONE-ACETAMINOPHEN 5-325 MG PO TABS
1.0000 | ORAL_TABLET | Freq: Four times a day (QID) | ORAL | 0 refills | Status: DC | PRN
Start: 2016-02-24 — End: 2016-05-25

## 2016-02-24 MED ORDER — HYDROCODONE-ACETAMINOPHEN 5-325 MG PO TABS: 1.0000 | ORAL_TABLET | Freq: Four times a day (QID) | ORAL | 0 refills | Status: DC | PRN

## 2016-02-24 MED ORDER — HYDROCODONE-ACETAMINOPHEN 5-325 MG PO TABS
1.0000 | ORAL_TABLET | Freq: Four times a day (QID) | ORAL | 0 refills | Status: DC | PRN
Start: 2016-02-24 — End: 2016-02-24
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
## 2016-03-09 ENCOUNTER — Encounter: Payer: Self-pay | Admitting: Cardiovascular Disease

## 2016-03-09 ENCOUNTER — Ambulatory Visit (INDEPENDENT_AMBULATORY_CARE_PROVIDER_SITE_OTHER): Payer: Commercial Managed Care - HMO | Admitting: Cardiovascular Disease

## 2016-03-09 VITALS — BP 140/54 | HR 88 | Ht 68.0 in | Wt 151.5 lb

## 2016-03-09 DIAGNOSIS — I4891 Unspecified atrial fibrillation: Secondary | ICD-10-CM

## 2016-03-09 DIAGNOSIS — R0600 Dyspnea, unspecified: Secondary | ICD-10-CM | POA: Diagnosis not present

## 2016-03-09 DIAGNOSIS — I10 Essential (primary) hypertension: Secondary | ICD-10-CM | POA: Diagnosis not present
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
## 2016-03-09 NOTE — Patient Instructions (Addendum)
Medication Instructions:  Your physician recommends that you continue on your current medications as directed. Please refer to the Current Medication list given to you today.   Labwork: none  Testing/Procedures: Your physician has requested that you have a lexiscan myoview. For further information please visit [email or web address]. Please follow instruction sheet, as given.  [place]  Your caregiver has ordered a Stress Test with nuclear imaging. The purpose of this test is to evaluate the blood supply to your heart muscle. This procedure is referred to as a "Non-Invasive Stress Test." This is because other than having an IV started in your vein, nothing is inserted or "invades" your body. Cardiac stress tests are done to find areas of poor blood flow to the heart by determining the extent of coronary artery disease (CAD). Some patients exercise on a treadmill, which naturally increases the blood flow to your heart, while others who are  unable to walk on a treadmill due to physical limitations have a pharmacologic/chemical stress agent called Lexiscan . This medicine will mimic walking on a treadmill by temporarily increasing your coronary blood flow.   Please note: these test may take anywhere between 2-4 hours to complete  PLEASE REPORT TO [facility] AT THE FIRST DESK WILL DIRECT YOU WHERE TO GO  Date of Procedure:_Thursday, Oct 19___  Arrival Time for Procedure:__7:15am___  Instructions regarding medication:   _xx___ : Hold insulin and metformin morning of procedure. Take only 1/2 dose of evening insulin the night before.     PLEASE NOTIFY THE OFFICE AT LEAST 73 HOURS IN ADVANCE IF YOU ARE UNABLE TO KEEP YOUR APPOINTMENT.  [phone] AND  PLEASE NOTIFY NUCLEAR MEDICINE AT [facility] AT LEAST 24 HOURS IN ADVANCE IF YOU ARE UNABLE TO KEEP YOUR APPOINTMENT. [phone]  How to prepare for your Myoview test:  1. Do not eat or drink after  midnight 2. No caffeine for 24 hours prior to test 3. No smoking 24 hours prior to test. 4. Your medication may be taken with water.  If your doctor stopped a medication because of this test, do not take that medication. 5. Ladies, please do not wear dresses.  Skirts or pants are appropriate. Please wear a short sleeve shirt. 6. No perfume, cologne or lotion. 7. Wear comfortable walking shoes. No heels!            Follow-Up: Your physician wants you to follow-up in: six months with Dr. Fletcher Anon. You will receive a reminder letter in the mail two months in advance. If you don't receive a letter, please call our office to schedule the follow-up appointment.   Any Other Special Instructions Will Be Listed Below (If Applicable).     If you need a refill on your cardiac medications before your next appointment, please call your pharmacy.  Cardiac Nuclear Scanning A cardiac nuclear scan is used to check your heart for problems, such as the following:  A portion of the heart is not getting enough blood.  Part of the heart muscle has died, which happens with a heart attack.  The heart wall is not working normally.  In this test, a radioactive dye (tracer) is injected into your bloodstream. After the tracer has traveled to your heart, a scanning device is used to measure how much of the tracer is absorbed by or distributed to various areas of your heart. LET [facility] CARE PROVIDER KNOW ABOUT:  Any allergies you have.  All medicines you are taking, including vitamins,  herbs, eye drops, creams, and over-the-counter medicines.  Previous problems you or members of your family have had with the use of anesthetics.  Any blood disorders you have.  Previous surgeries you have had.  Medical conditions you have.  RISKS AND COMPLICATIONS Generally, this is a safe procedure. However, as with any procedure, problems can occur. Possible problems include:   Serious chest pain.  Rapid  heartbeat.  Sensation of warmth in your chest. This usually passes quickly. BEFORE THE PROCEDURE Ask your health care provider about changing or stopping your regular medicines. PROCEDURE This procedure is usually done at a hospital and takes 2-4 hours.  An IV tube is inserted into one of your veins.  Your health care provider will inject a small amount of radioactive tracer through the tube.  You will then wait for 20-40 minutes while the tracer travels through your bloodstream.  You will lie down on an exam table so images of your heart can be taken. Images will be taken for about 15-20 minutes.  You will exercise on a treadmill or stationary bike. While you exercise, your heart activity will be monitored with an electrocardiogram (ECG), and your blood pressure will be checked.  If you are unable to exercise, you may be given a medicine to make your heart beat faster.  When blood flow to your heart has peaked, tracer will again be injected through the IV tube.  After 20-40 minutes, you will get back on the exam table and have more images taken of your heart.  When the procedure is over, your IV tube will be removed. AFTER THE PROCEDURE  You will likely be able to leave shortly after the test. Unless your health care provider tells you otherwise, you may return to your normal schedule, including diet, activities, and medicines.  Make sure you find out how and when you will get your test results.   This information is not intended to replace advice given to you by your health care provider. Make sure you discuss any questions you have with your health care provider.   Document Released: 06/09/2004 Document Revised: 05/20/2013 Document Reviewed: 04/23/2013 Elsevier Interactive Patient Education [employer].

## 2016-03-14 ENCOUNTER — Telehealth: Payer: Self-pay | Admitting: Cardiovascular Disease

## 2016-03-14 NOTE — Telephone Encounter (Signed)
Reviewed myoview instructions w/pt who verbalized understanding.  

## 2016-03-16 ENCOUNTER — Encounter
Admission: RE | Admit: 2016-03-16 | Discharge: 2016-03-16 | Disposition: A | Discharge status: Home / Self Care | Payer: Commercial Managed Care - HMO | Source: Ambulatory Visit | Attending: Cardiovascular Disease | Admitting: Cardiovascular Disease

## 2016-03-16 DIAGNOSIS — R0600 Dyspnea, unspecified: Secondary | ICD-10-CM | POA: Insufficient documentation

## 2016-03-16 LAB — NM MYOCAR MULTI W/SPECT W/WALL MOTION / EF
Estimated workload: 10.1 METS
Exercise duration (min): 8 min
Exercise duration (sec): 1 s
LV dias vol: 44 mL (ref 62–150)
LV sys vol: 12 mL
Peak HR: 136 {beats}/min
Percent HR: 90 %
Rest HR: 67 {beats}/min
SDS: 0
SRS: 6
SSS: 1
TID: 0.75

## 2016-03-16 MED ORDER — TECHNETIUM TC 99M TETROFOSMIN IV KIT
30.5670 | PACK | Freq: Once | INTRAVENOUS | Status: AC | PRN
  Administered 2016-03-16: 30.567 via INTRAVENOUS

## 2016-03-16 MED ORDER — TECHNETIUM TC 99M TETROFOSMIN IV KIT
13.0000 | PACK | Freq: Once | INTRAVENOUS | Status: AC | PRN
  Administered 2016-03-16: 13.26 via INTRAVENOUS

## 2016-03-25 ENCOUNTER — Other Ambulatory Visit: Payer: Self-pay | Admitting: Physician Assistant

## 2016-03-25 DIAGNOSIS — I4891 Unspecified atrial fibrillation: Secondary | ICD-10-CM

## 2016-03-27 ENCOUNTER — Telehealth: Payer: Self-pay | Admitting: Physician Assistant

## 2016-03-27 DIAGNOSIS — I4891 Unspecified atrial fibrillation: Secondary | ICD-10-CM

## 2016-03-27 MED ORDER — APIXABAN 5 MG PO TABS: 5.0000 mg | ORAL_TABLET | Freq: Two times a day (BID) | ORAL | 0 refills | Status: DC

## 2016-03-27 NOTE — Telephone Encounter (Signed)
Patient called in states [facility] on [address]. Was supposed to send a request for his eliquis he has been out of medication since Friday. He would like to know if he can have a 3 month supply on this medication.  CB# [phone]

## 2016-03-27 NOTE — Telephone Encounter (Signed)
Eliquis refilled.  

## 2016-05-12 ENCOUNTER — Other Ambulatory Visit: Payer: Self-pay | Admitting: Physician Assistant

## 2016-05-12 NOTE — Telephone Encounter (Signed)
Approved. # 50 [phone] 5

## 2016-05-12 NOTE — Telephone Encounter (Signed)
Prescription sent to pharmacy.

## 2016-05-12 NOTE — Telephone Encounter (Signed)
Ok to refill 

## 2016-05-15 ENCOUNTER — Other Ambulatory Visit: Payer: Self-pay | Admitting: Family Medicine

## 2016-05-15 ENCOUNTER — Other Ambulatory Visit: Payer: Self-pay | Admitting: Physician Assistant

## 2016-05-15 NOTE — Telephone Encounter (Signed)
Rx filled per protocol  

## 2016-05-25 ENCOUNTER — Ambulatory Visit (INDEPENDENT_AMBULATORY_CARE_PROVIDER_SITE_OTHER): Payer: Commercial Managed Care - HMO | Admitting: Physician Assistant

## 2016-05-25 ENCOUNTER — Encounter: Payer: Self-pay | Admitting: Physician Assistant

## 2016-05-25 VITALS — BP 140/52 | HR 75 | Temp 98.2°F | Resp 16 | Wt 144.0 lb

## 2016-05-25 DIAGNOSIS — E079 Disorder of thyroid, unspecified: Secondary | ICD-10-CM | POA: Diagnosis not present

## 2016-05-25 DIAGNOSIS — I4891 Unspecified atrial fibrillation: Secondary | ICD-10-CM | POA: Diagnosis not present

## 2016-05-25 DIAGNOSIS — Z125 Encounter for screening for malignant neoplasm of prostate: Secondary | ICD-10-CM

## 2016-05-25 DIAGNOSIS — H3530 Unspecified macular degeneration: Secondary | ICD-10-CM

## 2016-05-25 DIAGNOSIS — E785 Hyperlipidemia, unspecified: Secondary | ICD-10-CM | POA: Diagnosis not present

## 2016-05-25 DIAGNOSIS — I10 Essential (primary) hypertension: Secondary | ICD-10-CM

## 2016-05-25 DIAGNOSIS — N529 Male erectile dysfunction, unspecified: Secondary | ICD-10-CM

## 2016-05-25 DIAGNOSIS — G894 Chronic pain syndrome: Secondary | ICD-10-CM

## 2016-05-25 DIAGNOSIS — E119 Type 2 diabetes mellitus without complications: Secondary | ICD-10-CM | POA: Diagnosis not present

## 2016-05-25 DIAGNOSIS — Z794 Long term (current) use of insulin: Secondary | ICD-10-CM

## 2016-05-25 MED ORDER — HYDROCODONE-ACETAMINOPHEN 5-325 MG PO TABS: 1.0000 | ORAL_TABLET | Freq: Four times a day (QID) | ORAL | 0 refills | Status: DC | PRN

## 2016-05-25 MED ORDER — HYDROCODONE-ACETAMINOPHEN 5-325 MG PO TABS
1.0000 | ORAL_TABLET | Freq: Four times a day (QID) | ORAL | 0 refills | Status: DC | PRN
Start: 2016-05-25 — End: 2016-05-25
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
## 2016-05-26 ENCOUNTER — Other Ambulatory Visit: Payer: Self-pay

## 2016-05-26 LAB — LIPID PANEL
CHOLESTEROL: 138 mg/dL (ref <200)
HDL: 58 mg/dL (ref >40)
LDL Cholesterol: 68 mg/dL (ref <100)
Total CHOL/HDL Ratio: 2.4 Ratio (ref <5.0)
Triglycerides: 60 mg/dL (ref <150)
VLDL: 12 mg/dL (ref <30)

## 2016-05-26 LAB — COMPLETE METABOLIC PANEL WITH GFR
ALBUMIN: 4.3 g/dL (ref 3.6–5.1)
ALK PHOS: 67 U/L (ref 40–115)
ALT: 16 U/L (ref 9–46)
AST: 20 U/L (ref 10–35)
BILIRUBIN TOTAL: 0.5 mg/dL (ref 0.2–1.2)
BUN: 16 mg/dL (ref 7–25)
CALCIUM: 9.5 mg/dL (ref 8.6–10.3)
CO2: 24 mmol/L (ref 20–31)
Chloride: 104 mmol/L (ref 98–110)
Creat: 0.78 mg/dL (ref 0.70–1.18)
Glucose, Bld: 218 mg/dL — ABNORMAL HIGH (ref 70–99)
POTASSIUM: 4.7 mmol/L (ref 3.5–5.3)
Sodium: 140 mmol/L (ref 135–146)
TOTAL PROTEIN: 7 g/dL (ref 6.1–8.1)

## 2016-05-26 LAB — HEMOGLOBIN A1C
HEMOGLOBIN A1C: 7.3 % — AB (ref <5.7)
Mean Plasma Glucose: 163 mg/dL

## 2016-05-26 LAB — TSH: TSH: 0.2 m[IU]/L — AB (ref 0.40–4.50)

## 2016-05-26 MED ORDER — LEVOTHYROXINE SODIUM 112 MCG PO TABS: 112.0000 ug | ORAL_TABLET | Freq: Every day | ORAL | 2 refills | Status: DC

## 2016-06-01 ENCOUNTER — Telehealth: Payer: Self-pay

## 2016-06-01 NOTE — Telephone Encounter (Signed)
[employer] faxed over a clarification request for levothyroxine.  Pt was taking levothyroxine 125 mcg and after having lab work done  The Rx was changed to levothyroxine 112 mcg by the provider.  [employer] has made changes in their system

## 2016-06-12 ENCOUNTER — Other Ambulatory Visit: Payer: Self-pay | Admitting: Physician Assistant

## 2016-06-12 DIAGNOSIS — I4891 Unspecified atrial fibrillation: Secondary | ICD-10-CM

## 2016-06-19 ENCOUNTER — Telehealth: Payer: Self-pay

## 2016-06-19 DIAGNOSIS — I4891 Unspecified atrial fibrillation: Secondary | ICD-10-CM

## 2016-06-19 NOTE — Telephone Encounter (Signed)
Pt states his Eliquis is $250 and he is not paying that much. Pt states he will not take the med. Advised pt to contact ins company to see what they will cover and give [place] a call bck to see if the Rx can be changed to another blood thinner.   Pt states it does not matter to him if he takes it or not and he may call the ins company advised pt he should try and see what ins will cover b/c he need to take the medication.

## 2016-06-19 NOTE — Telephone Encounter (Signed)
He definitely needs to be on a blood thinner. Often insurance makes changes at the beginning of the year so it is likely that insurance will cover a different medication. With just a simple phone call we can find out which medication they will cover and can change to that medication.

## 2016-06-20 MED ORDER — APIXABAN 5 MG PO TABS: 5.0000 mg | ORAL_TABLET | Freq: Two times a day (BID) | ORAL | 0 refills | Status: DC

## 2016-06-20 NOTE — Telephone Encounter (Signed)
Spoke with pt I changed his 90 supply to 30 day pt is fine with that and will pay $47. Pt states that is better and will continue taking the medication

## 2016-06-20 NOTE — Telephone Encounter (Signed)
Spoke with [facility] and the alternatives for eliquis are as follows: Eliquis 5mg  $47.00 Eliquis 5mg  30 day $47.00 90 day $250.00 xeralt10mg  xeralto 20mg   $47.00 for 30day  90 day $131.00  [employer] states pt is in deduc stage and has a co insur that goes towards pt max out of pocket pay.  LVM for pt to call bck and discuss

## 2016-06-23 ENCOUNTER — Telehealth: Payer: Self-pay

## 2016-06-23 NOTE — Telephone Encounter (Signed)
Pt states when he went to p/u his Eliquis the pharmacy told him his charge would be 250.00  When I called pharmacy and explained [employer] told me if we did 30 day supply it would be $47 Pharmacy stated pt needed to meet deduc first  [place] and spoke with Chad in [place] mail order as well as Jacob Moores with the pharmacist at [facility] , Janett Billow  states the ded is $195 + $47.00 for medication   Only alternative is  Warfarin which is $2  All other alt are either the same price or higher  Pls advise b/c pt is not wanting to pay this amount he states it is to high

## 2016-06-26 NOTE — Telephone Encounter (Signed)
Have pt come in for OV to discuss.

## 2016-06-27 NOTE — Telephone Encounter (Signed)
Spoke with pt explained to him his ins states he has to pay his dedu first before the cost of his med would be normal price of $47.00 which is what he is use to paying.    I then explained to the patient M.Dixon wants him to come in  to discuss other options. Pt states he is not spending his hard earned money and will wait until March at his next appt to discuss options with M.Dixon.

## 2016-08-14 ENCOUNTER — Other Ambulatory Visit: Payer: Self-pay | Admitting: Physician Assistant

## 2016-08-14 NOTE — Telephone Encounter (Signed)
Refill appropriate 

## 2016-08-19 ENCOUNTER — Other Ambulatory Visit: Payer: Self-pay | Admitting: Physician Assistant

## 2016-08-21 NOTE — Telephone Encounter (Signed)
Refill appropriate 

## 2016-08-23 ENCOUNTER — Encounter: Payer: Self-pay | Admitting: Physician Assistant

## 2016-08-23 ENCOUNTER — Ambulatory Visit (INDEPENDENT_AMBULATORY_CARE_PROVIDER_SITE_OTHER): Payer: Self-pay | Admitting: Physician Assistant

## 2016-08-23 VITALS — BP 134/62 | HR 62 | Temp 97.6°F | Resp 14 | Wt 154.0 lb

## 2016-08-23 DIAGNOSIS — G894 Chronic pain syndrome: Secondary | ICD-10-CM

## 2016-08-23 DIAGNOSIS — E785 Hyperlipidemia, unspecified: Secondary | ICD-10-CM | POA: Diagnosis not present

## 2016-08-23 DIAGNOSIS — E079 Disorder of thyroid, unspecified: Secondary | ICD-10-CM | POA: Diagnosis not present

## 2016-08-23 DIAGNOSIS — I4891 Unspecified atrial fibrillation: Secondary | ICD-10-CM | POA: Diagnosis not present

## 2016-08-23 DIAGNOSIS — E119 Type 2 diabetes mellitus without complications: Secondary | ICD-10-CM | POA: Diagnosis not present

## 2016-08-23 DIAGNOSIS — Z794 Long term (current) use of insulin: Secondary | ICD-10-CM | POA: Diagnosis not present

## 2016-08-23 DIAGNOSIS — I10 Essential (primary) hypertension: Secondary | ICD-10-CM

## 2016-08-23 MED ORDER — HYDROCODONE-ACETAMINOPHEN 5-325 MG PO TABS: 1.0000 | ORAL_TABLET | Freq: Four times a day (QID) | ORAL | 0 refills | Status: DC | PRN
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
## 2016-08-24 ENCOUNTER — Telehealth: Payer: Self-pay

## 2016-08-24 LAB — COMPLETE METABOLIC PANEL WITH GFR
ALT: 17 U/L (ref 9–46)
AST: 20 U/L (ref 10–35)
Albumin: 4.3 g/dL (ref 3.6–5.1)
Alkaline Phosphatase: 51 U/L (ref 40–115)
BUN: 18 mg/dL (ref 7–25)
CHLORIDE: 102 mmol/L (ref 98–110)
CO2: 22 mmol/L (ref 20–31)
Calcium: 9.4 mg/dL (ref 8.6–10.3)
Creat: 1.09 mg/dL (ref 0.70–1.18)
GFR, EST AFRICAN AMERICAN: 79 mL/min (ref >=60)
GFR, EST NON AFRICAN AMERICAN: 68 mL/min (ref >=60)
Glucose, Bld: 109 mg/dL — ABNORMAL HIGH (ref 70–99)
POTASSIUM: 4.4 mmol/L (ref 3.5–5.3)
Sodium: 138 mmol/L (ref 135–146)
Total Bilirubin: 0.5 mg/dL (ref 0.2–1.2)
Total Protein: 7 g/dL (ref 6.1–8.1)

## 2016-08-24 LAB — TSH: TSH: 0.49 m[IU]/L (ref 0.40–4.50)

## 2016-08-24 LAB — MICROALBUMIN, URINE: MICROALB UR: 0.3 mg/dL

## 2016-08-24 LAB — HEMOGLOBIN A1C
HEMOGLOBIN A1C: 7.3 % — AB (ref <5.7)
Mean Plasma Glucose: 163 mg/dL

## 2016-08-24 NOTE — Telephone Encounter (Signed)
[employer] sent over a fax requesting clarification on patient thyroid medication.They were asking if he should be taking levothyroxine or synthroid,levothroid,  I explained patient should be taking levothyroxine 149mcg

## 2016-09-26 ENCOUNTER — Ambulatory Visit (INDEPENDENT_AMBULATORY_CARE_PROVIDER_SITE_OTHER): Payer: Medicare HMO | Admitting: Cardiovascular Disease

## 2016-09-26 ENCOUNTER — Encounter: Payer: Self-pay | Admitting: Cardiovascular Disease

## 2016-09-26 VITALS — BP 124/62 | HR 67 | Ht 68.0 in | Wt 152.0 lb

## 2016-09-26 DIAGNOSIS — I10 Essential (primary) hypertension: Secondary | ICD-10-CM

## 2016-09-26 DIAGNOSIS — I480 Paroxysmal atrial fibrillation: Secondary | ICD-10-CM

## 2016-09-26 DIAGNOSIS — E785 Hyperlipidemia, unspecified: Secondary | ICD-10-CM

## 2016-09-26 DIAGNOSIS — I340 Nonrheumatic mitral (valve) insufficiency: Secondary | ICD-10-CM
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
## 2016-09-26 NOTE — Patient Instructions (Signed)
Medication Instructions: Continue same medications.   Labwork: None.   Procedures/Testing: None.   Follow-Up: 6 months with Dr. Fletcher Anon  Any Additional Special Instructions Will Be Listed Below (If Applicable).  Check with your insurance about the cost of Xarelto and Eliquis.    If you need a refill on your cardiac medications before your next appointment, please call your pharmacy.

## 2016-09-28 ENCOUNTER — Ambulatory Visit (INDEPENDENT_AMBULATORY_CARE_PROVIDER_SITE_OTHER): Payer: Medicare HMO | Admitting: Podiatry

## 2016-09-28 DIAGNOSIS — E1140 Type 2 diabetes mellitus with diabetic neuropathy, unspecified: Secondary | ICD-10-CM

## 2016-09-28 DIAGNOSIS — E1149 Type 2 diabetes mellitus with other diabetic neurological complication: Secondary | ICD-10-CM

## 2016-09-28 DIAGNOSIS — Q828 Other specified congenital malformations of skin: Secondary | ICD-10-CM | POA: Diagnosis not present

## 2016-09-28 DIAGNOSIS — M79671 Pain in right foot: Secondary | ICD-10-CM

## 2016-09-28 DIAGNOSIS — I999 Unspecified disorder of circulatory system: Secondary | ICD-10-CM | POA: Diagnosis not present

## 2016-09-28 DIAGNOSIS — M79672 Pain in left foot: Secondary | ICD-10-CM | POA: Diagnosis not present

## 2016-09-30 NOTE — Progress Notes (Signed)
Subjective:    Patient ID: Zachary Reed., male   DOB: 71 y.o.   MRN: [id number]   HPI patient presents with significant lesion formation bilateral and is not interested in any other types of treatment    ROS      Objective:  Physical Exam vascular status is diminished with both DP PT pulses even though patient has only mild claudication symptoms there is definite diminishment of vascular status. Patient's found have keratotic lesions bilateral that are painful and he is a diabetic with diminished vibratory sensation diminished DTR reflexes and sharp dull testing     Assessment:    At risk diabetic with nail disease and lesion formation who has probable moderate to significant vascular disease     Plan:    Try to make him an appointment with vascular doctor and he is refusing it at the current time stating he will not go. I explained the risk and the fact he could develop ultimate ulceration or ultimately needing amputation he is completely aware this and does not want treatment. I debrided lesions on both feet with no iatrogenic bleeding and will be seen back and discussed long-term diabetic shoes

## 2016-10-09 ENCOUNTER — Telehealth: Payer: Self-pay | Admitting: *Deleted

## 2016-10-09 NOTE — Telephone Encounter (Signed)
Pt states he would like a note so he would not have to wear steel toed shoes, he has neuropathy and poor circulation.

## 2016-10-10 NOTE — Telephone Encounter (Signed)
fine

## 2016-10-11 ENCOUNTER — Encounter: Payer: Self-pay | Admitting: *Deleted

## 2016-10-11 NOTE — Telephone Encounter (Signed)
Hello I'm trying to get a note written by Dr. Paulla Dolly about wearing safety shoes to work. This is the third time I've tried calling and have not been able to get in touch with you. If you would please call me back and leave a message on my home answering machine which is [phone] or I can come to the office.

## 2016-10-11 NOTE — Telephone Encounter (Signed)
Left message informing pt the note not to wear steel toed shoes would be waiting at the front desk and that I was not able to generate a note to that affect without an order from Dr. Paulla Dolly and that I had only received his 1st message and the message from [place] today.

## 2016-10-17 ENCOUNTER — Other Ambulatory Visit: Payer: Self-pay | Admitting: Physician Assistant

## 2016-10-17 DIAGNOSIS — I4891 Unspecified atrial fibrillation: Secondary | ICD-10-CM

## 2016-10-18 NOTE — Telephone Encounter (Signed)
Refill appropriate 

## 2016-10-19 ENCOUNTER — Other Ambulatory Visit: Payer: Self-pay | Admitting: Physician Assistant

## 2016-10-26 ENCOUNTER — Other Ambulatory Visit: Payer: Self-pay | Admitting: Physician Assistant

## 2016-11-23 ENCOUNTER — Encounter: Payer: Self-pay | Admitting: Physician Assistant

## 2016-11-23 ENCOUNTER — Ambulatory Visit (INDEPENDENT_AMBULATORY_CARE_PROVIDER_SITE_OTHER): Payer: Medicare HMO | Admitting: Physician Assistant

## 2016-11-23 VITALS — BP 120/68 | HR 69 | Temp 97.9°F | Resp 14 | Ht 68.0 in | Wt 157.6 lb

## 2016-11-23 DIAGNOSIS — I4891 Unspecified atrial fibrillation: Secondary | ICD-10-CM | POA: Diagnosis not present

## 2016-11-23 DIAGNOSIS — E785 Hyperlipidemia, unspecified: Secondary | ICD-10-CM | POA: Diagnosis not present

## 2016-11-23 DIAGNOSIS — Z794 Long term (current) use of insulin: Secondary | ICD-10-CM | POA: Diagnosis not present

## 2016-11-23 DIAGNOSIS — E119 Type 2 diabetes mellitus without complications: Secondary | ICD-10-CM

## 2016-11-23 DIAGNOSIS — N529 Male erectile dysfunction, unspecified: Secondary | ICD-10-CM | POA: Diagnosis not present

## 2016-11-23 DIAGNOSIS — I10 Essential (primary) hypertension: Secondary | ICD-10-CM

## 2016-11-23 DIAGNOSIS — E079 Disorder of thyroid, unspecified: Secondary | ICD-10-CM | POA: Diagnosis not present

## 2016-11-23 DIAGNOSIS — G894 Chronic pain syndrome: Secondary | ICD-10-CM

## 2016-11-23 LAB — HEMOGLOBIN A1C, FINGERSTICK: HEMOGLOBIN A1C, FINGERSTICK: 7.8 % — AB (ref <5.7)

## 2016-11-23 MED ORDER — HYDROCODONE-ACETAMINOPHEN 5-325 MG PO TABS: 1.0000 | ORAL_TABLET | Freq: Four times a day (QID) | ORAL | 0 refills | Status: DC | PRN
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
## 2017-01-02 ENCOUNTER — Other Ambulatory Visit: Payer: Self-pay | Admitting: Physician Assistant

## 2017-01-02 NOTE — Telephone Encounter (Signed)
Refill appropriate 

## 2017-02-21 ENCOUNTER — Ambulatory Visit (INDEPENDENT_AMBULATORY_CARE_PROVIDER_SITE_OTHER): Payer: Medicare HMO | Admitting: Physician Assistant

## 2017-02-21 ENCOUNTER — Encounter: Payer: Self-pay | Admitting: Physician Assistant

## 2017-02-21 VITALS — BP 118/60 | HR 70 | Temp 98.5°F | Resp 16 | Ht 68.0 in | Wt 155.2 lb

## 2017-02-21 DIAGNOSIS — I10 Essential (primary) hypertension: Secondary | ICD-10-CM

## 2017-02-21 DIAGNOSIS — E079 Disorder of thyroid, unspecified: Secondary | ICD-10-CM

## 2017-02-21 DIAGNOSIS — Z1159 Encounter for screening for other viral diseases: Secondary | ICD-10-CM | POA: Diagnosis not present

## 2017-02-21 DIAGNOSIS — H3530 Unspecified macular degeneration: Secondary | ICD-10-CM

## 2017-02-21 DIAGNOSIS — E119 Type 2 diabetes mellitus without complications: Secondary | ICD-10-CM | POA: Diagnosis not present

## 2017-02-21 DIAGNOSIS — Z125 Encounter for screening for malignant neoplasm of prostate: Secondary | ICD-10-CM

## 2017-02-21 DIAGNOSIS — Z794 Long term (current) use of insulin: Secondary | ICD-10-CM

## 2017-02-21 DIAGNOSIS — E785 Hyperlipidemia, unspecified: Secondary | ICD-10-CM

## 2017-02-21 DIAGNOSIS — I4891 Unspecified atrial fibrillation: Secondary | ICD-10-CM | POA: Diagnosis not present

## 2017-02-21 DIAGNOSIS — G894 Chronic pain syndrome: Secondary | ICD-10-CM | POA: Diagnosis not present

## 2017-02-21 MED ORDER — HYDROCODONE-ACETAMINOPHEN 5-325 MG PO TABS: 1.0000 | ORAL_TABLET | Freq: Four times a day (QID) | ORAL | 0 refills | Status: DC | PRN

## 2017-02-21 MED ORDER — METFORMIN HCL 500 MG PO TABS: 500.0000 mg | ORAL_TABLET | Freq: Two times a day (BID) | ORAL | 2 refills | Status: DC

## 2017-02-21 MED ORDER — LISINOPRIL 10 MG PO TABS
10.0000 mg | ORAL_TABLET | Freq: Every day | ORAL | 2 refills | Status: DC
Start: 2017-02-21 — End: 2017-05-21
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
## 2017-02-22 LAB — COMPLETE METABOLIC PANEL WITH GFR
AG Ratio: 1.8 (calc) (ref 1.0–2.5)
ALBUMIN MSPROF: 4.4 g/dL (ref 3.6–5.1)
ALKALINE PHOSPHATASE (APISO): 54 U/L (ref 40–115)
ALT: 16 U/L (ref 9–46)
AST: 23 U/L (ref 10–35)
BUN / CREAT RATIO: 13 (calc) (ref 6–22)
BUN: 17 mg/dL (ref 7–25)
CO2: 22 mmol/L (ref 20–32)
CREATININE: 1.26 mg/dL — AB (ref 0.70–1.18)
Calcium: 9.2 mg/dL (ref 8.6–10.3)
Chloride: 101 mmol/L (ref 98–110)
GFR, Est African American: 66 mL/min/{1.73_m2} (ref >=60)
GFR, Est Non African American: 57 mL/min/{1.73_m2} — ABNORMAL LOW (ref >=60)
GLOBULIN: 2.4 g/dL (ref 1.9–3.7)
Glucose, Bld: 94 mg/dL (ref 65–99)
Potassium: 4.4 mmol/L (ref 3.5–5.3)
SODIUM: 136 mmol/L (ref 135–146)
Total Bilirubin: 0.4 mg/dL (ref 0.2–1.2)
Total Protein: 6.8 g/dL (ref 6.1–8.1)

## 2017-02-22 LAB — HEMOGLOBIN A1C
HEMOGLOBIN A1C: 7.6 %{Hb} — AB (ref <5.7)
Mean Plasma Glucose: 171 (calc)
eAG (mmol/L): 9.5 (calc)

## 2017-02-22 LAB — HEPATITIS C ANTIBODY
HEP C AB: NONREACTIVE
SIGNAL TO CUT-OFF: 0.02 (ref <1.00)

## 2017-02-22 LAB — PSA: PSA: 1.5 ng/mL (ref <=4.0)

## 2017-02-22 LAB — TSH: TSH: 0.37 mIU/L — ABNORMAL LOW (ref 0.40–4.50)

## 2017-03-09 ENCOUNTER — Other Ambulatory Visit: Payer: Self-pay | Admitting: Family Medicine

## 2017-05-21 ENCOUNTER — Other Ambulatory Visit: Payer: Self-pay | Admitting: Physician Assistant

## 2017-05-24 ENCOUNTER — Encounter: Payer: Self-pay | Admitting: Physician Assistant

## 2017-05-24 ENCOUNTER — Other Ambulatory Visit: Payer: Self-pay

## 2017-05-24 ENCOUNTER — Ambulatory Visit: Payer: Medicare HMO | Admitting: Physician Assistant

## 2017-05-24 ENCOUNTER — Other Ambulatory Visit: Payer: Self-pay | Admitting: Physician Assistant

## 2017-05-24 ENCOUNTER — Telehealth: Payer: Self-pay

## 2017-05-24 VITALS — BP 140/76 | HR 82 | Temp 97.9°F | Resp 16 | Ht 68.0 in | Wt 148.0 lb

## 2017-05-24 DIAGNOSIS — E785 Hyperlipidemia, unspecified: Secondary | ICD-10-CM | POA: Diagnosis not present

## 2017-05-24 DIAGNOSIS — E119 Type 2 diabetes mellitus without complications: Secondary | ICD-10-CM

## 2017-05-24 DIAGNOSIS — I10 Essential (primary) hypertension: Secondary | ICD-10-CM | POA: Diagnosis not present

## 2017-05-24 DIAGNOSIS — I4891 Unspecified atrial fibrillation: Secondary | ICD-10-CM | POA: Diagnosis not present

## 2017-05-24 DIAGNOSIS — Z794 Long term (current) use of insulin: Secondary | ICD-10-CM

## 2017-05-24 DIAGNOSIS — G894 Chronic pain syndrome: Secondary | ICD-10-CM | POA: Diagnosis not present

## 2017-05-24 DIAGNOSIS — Z125 Encounter for screening for malignant neoplasm of prostate: Secondary | ICD-10-CM

## 2017-05-24 DIAGNOSIS — E079 Disorder of thyroid, unspecified: Secondary | ICD-10-CM

## 2017-05-24 MED ORDER — HYDROCODONE-ACETAMINOPHEN 5-325 MG PO TABS: 1.0000 | ORAL_TABLET | Freq: Four times a day (QID) | ORAL | 0 refills | Status: DC | PRN
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
## 2017-05-25 LAB — COMPLETE METABOLIC PANEL WITHOUT GFR
AG Ratio: 1.6 (calc) (ref 1.0–2.5)
ALT: 13 U/L (ref 9–46)
AST: 16 U/L (ref 10–35)
Albumin: 4.4 g/dL (ref 3.6–5.1)
Alkaline phosphatase (APISO): 47 U/L (ref 40–115)
BUN/Creatinine Ratio: 15 (calc) (ref 6–22)
BUN: 18 mg/dL (ref 7–25)
CO2: 27 mmol/L (ref 20–32)
Calcium: 9.8 mg/dL (ref 8.6–10.3)
Chloride: 101 mmol/L (ref 98–110)
Creat: 1.2 mg/dL — ABNORMAL HIGH (ref 0.70–1.18)
GFR, Est African American: 70 mL/min/1.73m2
GFR, Est Non African American: 60 mL/min/1.73m2
Globulin: 2.8 g/dL (ref 1.9–3.7)
Glucose, Bld: 196 mg/dL — ABNORMAL HIGH (ref 65–99)
Potassium: 5 mmol/L (ref 3.5–5.3)
Sodium: 136 mmol/L (ref 135–146)
Total Bilirubin: 0.5 mg/dL (ref 0.2–1.2)
Total Protein: 7.2 g/dL (ref 6.1–8.1)

## 2017-05-25 LAB — HEMOGLOBIN A1C
Hgb A1c MFr Bld: 7.5 %{Hb} — ABNORMAL HIGH
Mean Plasma Glucose: 169 (calc)
eAG (mmol/L): 9.3 (calc)

## 2017-05-25 LAB — LIPID PANEL
Cholesterol: 159 mg/dL
HDL: 63 mg/dL
LDL Cholesterol (Calc): 79 mg/dL
Non-HDL Cholesterol (Calc): 96 mg/dL
Total CHOL/HDL Ratio: 2.5 (calc)
Triglycerides: 83 mg/dL

## 2017-05-28 MED ORDER — FREESTYLE LIBRE 14 DAY SENSOR MISC: 1.0000 "application " | Freq: Every day | 3 refills | Status: DC

## 2017-05-28 MED ORDER — FREESTYLE LIBRE 14 DAY READER DEVI: 1.0000 "application " | Freq: Every day | 2 refills | Status: DC

## 2017-05-30 NOTE — Telephone Encounter (Signed)
Patient requested freestyle libre meter and sensor. RX for meter and sensor sent to [facility]

## 2017-06-04 ENCOUNTER — Other Ambulatory Visit: Payer: Self-pay | Admitting: Physician Assistant

## 2017-06-04 NOTE — Telephone Encounter (Signed)
Refill appropriate 

## 2017-06-06 ENCOUNTER — Other Ambulatory Visit: Payer: Self-pay

## 2017-06-11 MED ORDER — FREESTYLE LIBRE 14 DAY SENSOR MISC: 1.0000 "application " | Freq: Every day | 3 refills | Status: DC

## 2017-07-27 ENCOUNTER — Other Ambulatory Visit: Payer: Self-pay | Admitting: Physician Assistant

## 2017-07-28 ENCOUNTER — Other Ambulatory Visit: Payer: Self-pay | Admitting: Physician Assistant

## 2017-07-30 NOTE — Telephone Encounter (Signed)
Refill appropriate 

## 2017-08-07 ENCOUNTER — Other Ambulatory Visit: Payer: Self-pay | Admitting: Physician Assistant

## 2017-08-07 DIAGNOSIS — I4891 Unspecified atrial fibrillation: Secondary | ICD-10-CM

## 2017-08-23 ENCOUNTER — Other Ambulatory Visit: Payer: Self-pay | Admitting: Physician Assistant

## 2017-08-23 ENCOUNTER — Other Ambulatory Visit: Payer: Self-pay

## 2017-08-23 ENCOUNTER — Ambulatory Visit (INDEPENDENT_AMBULATORY_CARE_PROVIDER_SITE_OTHER): Payer: Medicare HMO | Admitting: Physician Assistant

## 2017-08-23 ENCOUNTER — Encounter: Payer: Self-pay | Admitting: Physician Assistant

## 2017-08-23 VITALS — BP 124/62 | HR 68 | Temp 98.1°F | Resp 16 | Ht 68.5 in | Wt 155.2 lb

## 2017-08-23 DIAGNOSIS — E119 Type 2 diabetes mellitus without complications: Secondary | ICD-10-CM | POA: Diagnosis not present

## 2017-08-23 DIAGNOSIS — E785 Hyperlipidemia, unspecified: Secondary | ICD-10-CM

## 2017-08-23 DIAGNOSIS — Z794 Long term (current) use of insulin: Secondary | ICD-10-CM

## 2017-08-23 DIAGNOSIS — I10 Essential (primary) hypertension: Secondary | ICD-10-CM

## 2017-08-23 DIAGNOSIS — G894 Chronic pain syndrome: Secondary | ICD-10-CM | POA: Diagnosis not present

## 2017-08-23 DIAGNOSIS — E079 Disorder of thyroid, unspecified: Secondary | ICD-10-CM

## 2017-08-23 MED ORDER — HYDROCODONE-ACETAMINOPHEN 5-325 MG PO TABS: 1.0000 | ORAL_TABLET | Freq: Four times a day (QID) | ORAL | 0 refills | Status: DC | PRN
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
## 2017-08-24 ENCOUNTER — Other Ambulatory Visit: Payer: Self-pay

## 2017-08-24 LAB — HEMOGLOBIN A1C
EAG (MMOL/L): 9.5 (calc)
Hgb A1c MFr Bld: 7.6 % of total Hgb — ABNORMAL HIGH (ref <5.7)
Mean Plasma Glucose: 171 (calc)

## 2017-08-24 LAB — TSH: TSH: 0.25 m[IU]/L — AB (ref 0.40–4.50)

## 2017-08-24 MED ORDER — LEVOTHYROXINE SODIUM 100 MCG PO TABS: 100.0000 ug | ORAL_TABLET | Freq: Every day | ORAL | 3 refills | Status: DC

## 2017-09-21 ENCOUNTER — Other Ambulatory Visit: Payer: Self-pay | Admitting: Physician Assistant

## 2017-09-21 DIAGNOSIS — G894 Chronic pain syndrome: Secondary | ICD-10-CM

## 2017-09-21 NOTE — Telephone Encounter (Signed)
Med refill on hydrocodone to [facility] [place]

## 2017-09-24 MED ORDER — HYDROCODONE-ACETAMINOPHEN 5-325 MG PO TABS: 1.0000 | ORAL_TABLET | Freq: Four times a day (QID) | ORAL | 0 refills | Status: DC | PRN

## 2017-09-24 NOTE — Telephone Encounter (Signed)
Last OV 08/23/2017 Last refill  08/23/2017 Okay to refill?

## 2017-10-24 ENCOUNTER — Other Ambulatory Visit: Payer: Self-pay

## 2017-10-24 DIAGNOSIS — G894 Chronic pain syndrome: Secondary | ICD-10-CM

## 2017-10-24 NOTE — Telephone Encounter (Signed)
Last OV 08/23/2017 Last refill 09/24/2017 Ok to refill?

## 2017-10-25 MED ORDER — HYDROCODONE-ACETAMINOPHEN 5-325 MG PO TABS: 1.0000 | ORAL_TABLET | Freq: Four times a day (QID) | ORAL | 0 refills | Status: DC | PRN

## 2017-10-27 ENCOUNTER — Other Ambulatory Visit: Payer: Self-pay | Admitting: Physician Assistant

## 2017-10-30 ENCOUNTER — Ambulatory Visit: Payer: Medicare HMO | Admitting: Cardiovascular Disease

## 2017-10-30 ENCOUNTER — Telehealth: Payer: Self-pay | Admitting: Cardiovascular Disease

## 2017-10-30 NOTE — Telephone Encounter (Signed)
lmov to r/s due to provider emergency .  Will attempt to contact again another time.

## 2017-11-01 ENCOUNTER — Ambulatory Visit: Payer: Medicare HMO | Admitting: Cardiovascular Disease

## 2017-11-01 ENCOUNTER — Encounter: Payer: Self-pay | Admitting: Cardiovascular Disease

## 2017-11-01 VITALS — BP 148/60 | HR 73 | Ht 68.0 in | Wt 155.0 lb

## 2017-11-01 DIAGNOSIS — I059 Rheumatic mitral valve disease, unspecified: Secondary | ICD-10-CM

## 2017-11-01 DIAGNOSIS — I10 Essential (primary) hypertension: Secondary | ICD-10-CM | POA: Diagnosis not present

## 2017-11-01 DIAGNOSIS — E785 Hyperlipidemia, unspecified: Secondary | ICD-10-CM

## 2017-11-01 DIAGNOSIS — I480 Paroxysmal atrial fibrillation: Secondary | ICD-10-CM

## 2017-11-01 DIAGNOSIS — Z79899 Other long term (current) drug therapy: Secondary | ICD-10-CM | POA: Diagnosis not present

## 2017-11-01 MED ORDER — RIVAROXABAN 20 MG PO TABS: 20.0000 mg | ORAL_TABLET | Freq: Every day | ORAL | 3 refills | Status: DC

## 2017-11-01 NOTE — Patient Instructions (Addendum)
Medication Instructions:  Your physician has recommended you make the following change in your medication:  1- STOP Aspirin. 2- START Xarelto 20 mg (1 tablet) by mouth once a day.   Labwork: Your physician recommends that you return for lab work ON SAME DAY AS THE ECHO IN THE OFFICE. (CBC, BMET).   Testing/Procedures: Your physician has requested that you have an echocardiogram. Echocardiography is a painless test that uses sound waves to create images of your heart. It provides your doctor with information about the size and shape of your heart and how well your heart's chambers and valves are working. This procedure takes approximately one hour. There are no restrictions for this procedure. You may get an IV, if needed, to receive an ultrasound enhancing agent through to better visualize your heart.     Follow-Up: Your physician wants you to follow-up in: [place].    If you need a refill on your cardiac medications before your next appointment, please call your pharmacy.   Echocardiogram An echocardiogram, or echocardiography, uses sound waves (ultrasound) to produce an image of your heart. The echocardiogram is simple, painless, obtained within a short period of time, and offers valuable information to your health care provider. The images from an echocardiogram can provide information such as:  Evidence of coronary artery disease (CAD).  Heart size.  Heart muscle function.  Heart valve function.  Aneurysm detection.  Evidence of a past heart attack.  Fluid buildup around the heart.  Heart muscle thickening.  Assess heart valve function.  Tell a health care provider about:  Any allergies you have.  All medicines you are taking, including vitamins, herbs, eye drops, creams, and over-the-counter medicines.  Any problems you or family members have had with anesthetic medicines.  Any blood disorders you have.  Any surgeries you have had.  Any  medical conditions you have.  Whether you are pregnant or may be pregnant. What happens before the procedure? No special preparation is needed. Eat and drink normally. What happens during the procedure?  In order to produce an image of your heart, gel will be applied to your chest and a wand-like tool (transducer) will be moved over your chest. The gel will help transmit the sound waves from the transducer. The sound waves will harmlessly bounce off your heart to allow the heart images to be captured in real-time motion. These images will then be recorded.  You may need an IV to receive a medicine that improves the quality of the pictures. What happens after the procedure? You may return to your normal schedule including diet, activities, and medicines, unless your health care provider tells you otherwise. This information is not intended to replace advice given to you by your health care provider. Make sure you discuss any questions you have with your health care provider. Document Released: 05/12/2000 Document Revised: 01/01/2016 Document Reviewed: 01/20/2013 Elsevier Interactive Patient Education  2017 [place].    Medication Samples have been provided to the patient.  Drug name: Alveda Reasons       Strength: 20MG         Qty: 3 BOTTLES  LOT: [id number]  Exp.Date: 08/2019
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
## 2017-11-07 ENCOUNTER — Ambulatory Visit (INDEPENDENT_AMBULATORY_CARE_PROVIDER_SITE_OTHER): Payer: Medicare HMO

## 2017-11-07 ENCOUNTER — Other Ambulatory Visit: Payer: Self-pay

## 2017-11-07 DIAGNOSIS — I480 Paroxysmal atrial fibrillation: Secondary | ICD-10-CM

## 2017-11-07 DIAGNOSIS — I059 Rheumatic mitral valve disease, unspecified: Secondary | ICD-10-CM

## 2017-11-09 ENCOUNTER — Other Ambulatory Visit: Payer: Self-pay | Admitting: Physician Assistant

## 2017-11-21 ENCOUNTER — Other Ambulatory Visit: Payer: Self-pay

## 2017-11-21 DIAGNOSIS — G894 Chronic pain syndrome: Secondary | ICD-10-CM

## 2017-11-21 MED ORDER — HYDROCODONE-ACETAMINOPHEN 5-325 MG PO TABS: 1.0000 | ORAL_TABLET | Freq: Four times a day (QID) | ORAL | 0 refills | Status: DC | PRN

## 2017-11-21 NOTE — Telephone Encounter (Signed)
Last OV 08/23/2017 Last refill 10/25/2017 Ok to refill?

## 2017-12-06 ENCOUNTER — Other Ambulatory Visit: Payer: Self-pay

## 2017-12-06 ENCOUNTER — Encounter: Payer: Self-pay | Admitting: Physician Assistant

## 2017-12-06 ENCOUNTER — Ambulatory Visit (INDEPENDENT_AMBULATORY_CARE_PROVIDER_SITE_OTHER): Payer: Medicare HMO | Admitting: Physician Assistant

## 2017-12-06 VITALS — BP 134/60 | HR 63 | Temp 98.2°F | Resp 14 | Ht 68.0 in | Wt 157.0 lb

## 2017-12-06 DIAGNOSIS — Z794 Long term (current) use of insulin: Secondary | ICD-10-CM | POA: Diagnosis not present

## 2017-12-06 DIAGNOSIS — I10 Essential (primary) hypertension: Secondary | ICD-10-CM

## 2017-12-06 DIAGNOSIS — E785 Hyperlipidemia, unspecified: Secondary | ICD-10-CM

## 2017-12-06 DIAGNOSIS — I4891 Unspecified atrial fibrillation: Secondary | ICD-10-CM

## 2017-12-06 DIAGNOSIS — E119 Type 2 diabetes mellitus without complications: Secondary | ICD-10-CM

## 2017-12-06 DIAGNOSIS — E079 Disorder of thyroid, unspecified: Secondary | ICD-10-CM

## 2017-12-06 MED ORDER — GLUCOSE BLOOD VI STRP: ORAL_STRIP | 3 refills | Status: DC

## 2017-12-06 MED ORDER — ACCU-CHEK FASTCLIX LANCET KIT: PACK | 0 refills | Status: DC
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
## 2017-12-07 LAB — MICROALBUMIN, URINE: Microalb, Ur: 0.5 mg/dL

## 2017-12-07 LAB — HEMOGLOBIN A1C
HEMOGLOBIN A1C: 7.4 %{Hb} — AB (ref <5.7)
Mean Plasma Glucose: 166 (calc)
eAG (mmol/L): 9.2 (calc)

## 2017-12-07 LAB — COMPLETE METABOLIC PANEL WITH GFR

## 2017-12-07 LAB — TSH: TSH: 1.07 mIU/L (ref 0.40–4.50)

## 2017-12-13 ENCOUNTER — Other Ambulatory Visit: Payer: Self-pay

## 2017-12-13 DIAGNOSIS — E1165 Type 2 diabetes mellitus with hyperglycemia: Secondary | ICD-10-CM

## 2017-12-13 DIAGNOSIS — IMO0002 Reserved for concepts with insufficient information to code with codable children: Secondary | ICD-10-CM

## 2017-12-13 MED ORDER — ACCU-CHEK SOFT TOUCH LANCETS MISC: 1.0000 | Freq: Three times a day (TID) | 3 refills | Status: DC

## 2017-12-21 ENCOUNTER — Other Ambulatory Visit: Payer: Self-pay

## 2017-12-21 DIAGNOSIS — G894 Chronic pain syndrome: Secondary | ICD-10-CM

## 2017-12-21 NOTE — Telephone Encounter (Signed)
Last OV 12/06/2017 Last refill 11/21/2017 Ok to refill?

## 2017-12-24 MED ORDER — HYDROCODONE-ACETAMINOPHEN 5-325 MG PO TABS: 1.0000 | ORAL_TABLET | Freq: Four times a day (QID) | ORAL | 0 refills | Status: DC | PRN

## 2018-01-21 ENCOUNTER — Other Ambulatory Visit: Payer: Self-pay | Admitting: Physician Assistant

## 2018-01-22 ENCOUNTER — Other Ambulatory Visit: Payer: Self-pay

## 2018-01-22 DIAGNOSIS — G894 Chronic pain syndrome: Secondary | ICD-10-CM

## 2018-01-22 NOTE — Telephone Encounter (Signed)
Last Ov 12/06/2017 Last refill 12/24/2017 Ok to refill?

## 2018-01-23 MED ORDER — HYDROCODONE-ACETAMINOPHEN 5-325 MG PO TABS: 1.0000 | ORAL_TABLET | Freq: Four times a day (QID) | ORAL | 0 refills | Status: DC | PRN

## 2018-02-16 ENCOUNTER — Other Ambulatory Visit: Payer: Self-pay | Admitting: Physician Assistant

## 2018-02-18 ENCOUNTER — Other Ambulatory Visit: Payer: Self-pay

## 2018-02-18 DIAGNOSIS — I4891 Unspecified atrial fibrillation: Secondary | ICD-10-CM

## 2018-02-18 MED ORDER — DILTIAZEM HCL ER COATED BEADS 120 MG PO CP24: 120.0000 mg | ORAL_CAPSULE | Freq: Every day | ORAL | 1 refills | Status: DC

## 2018-02-18 MED ORDER — GLUCOSE BLOOD VI STRP: ORAL_STRIP | 3 refills | Status: DC

## 2018-02-20 ENCOUNTER — Other Ambulatory Visit: Payer: Self-pay

## 2018-02-20 DIAGNOSIS — G894 Chronic pain syndrome: Secondary | ICD-10-CM

## 2018-02-20 DIAGNOSIS — I4891 Unspecified atrial fibrillation: Secondary | ICD-10-CM

## 2018-02-20 MED ORDER — GLUCOSE BLOOD VI STRP: ORAL_STRIP | 3 refills | Status: DC

## 2018-02-20 MED ORDER — DILTIAZEM HCL ER COATED BEADS 120 MG PO CP24
120.0000 mg | ORAL_CAPSULE | Freq: Every day | ORAL | 1 refills | Status: DC
Start: 2018-02-20 — End: 2018-10-09

## 2018-02-20 MED ORDER — HYDROCODONE-ACETAMINOPHEN 5-325 MG PO TABS: 1.0000 | ORAL_TABLET | Freq: Four times a day (QID) | ORAL | 0 refills | Status: DC | PRN

## 2018-02-20 NOTE — Telephone Encounter (Signed)
Last OV 12/06/2017 Last refill 01/23/2018 Ok to refill

## 2018-02-21 ENCOUNTER — Other Ambulatory Visit: Payer: Self-pay

## 2018-02-21 MED ORDER — GLUCOSE BLOOD VI STRP: ORAL_STRIP | 3 refills | Status: DC

## 2018-03-11 ENCOUNTER — Encounter: Payer: Self-pay | Admitting: Physician Assistant

## 2018-03-11 ENCOUNTER — Ambulatory Visit (INDEPENDENT_AMBULATORY_CARE_PROVIDER_SITE_OTHER): Payer: Medicare HMO | Admitting: Physician Assistant

## 2018-03-11 VITALS — BP 130/60 | HR 69 | Temp 98.3°F | Resp 18 | Ht 68.0 in | Wt 149.6 lb

## 2018-03-11 DIAGNOSIS — I10 Essential (primary) hypertension: Secondary | ICD-10-CM

## 2018-03-11 DIAGNOSIS — I4891 Unspecified atrial fibrillation: Secondary | ICD-10-CM

## 2018-03-11 DIAGNOSIS — Z794 Long term (current) use of insulin: Secondary | ICD-10-CM | POA: Diagnosis not present

## 2018-03-11 DIAGNOSIS — E785 Hyperlipidemia, unspecified: Secondary | ICD-10-CM | POA: Diagnosis not present

## 2018-03-11 DIAGNOSIS — N529 Male erectile dysfunction, unspecified: Secondary | ICD-10-CM

## 2018-03-11 DIAGNOSIS — E079 Disorder of thyroid, unspecified: Secondary | ICD-10-CM | POA: Diagnosis not present

## 2018-03-11 DIAGNOSIS — E119 Type 2 diabetes mellitus without complications: Secondary | ICD-10-CM | POA: Diagnosis not present

## 2018-03-11 MED ORDER — INSULIN ASPART 100 UNIT/ML ~~LOC~~ SOLN: SUBCUTANEOUS | 3 refills | Status: DC
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
## 2018-03-12 LAB — LIPID PANEL
CHOL/HDL RATIO: 2.8 (calc) (ref <5.0)
Cholesterol: 161 mg/dL (ref <200)
HDL: 57 mg/dL (ref >40)
LDL Cholesterol (Calc): 88 mg/dL (calc)
Non-HDL Cholesterol (Calc): 104 mg/dL (calc) (ref <130)
Triglycerides: 74 mg/dL (ref <150)

## 2018-03-12 LAB — COMPLETE METABOLIC PANEL WITH GFR
AG Ratio: 1.7 (calc) (ref 1.0–2.5)
ALBUMIN MSPROF: 4.4 g/dL (ref 3.6–5.1)
ALT: 11 U/L (ref 9–46)
AST: 16 U/L (ref 10–35)
Alkaline phosphatase (APISO): 45 U/L (ref 40–115)
BUN/Creatinine Ratio: 16 (calc) (ref 6–22)
BUN: 20 mg/dL (ref 7–25)
CALCIUM: 9.4 mg/dL (ref 8.6–10.3)
CO2: 24 mmol/L (ref 20–32)
CREATININE: 1.24 mg/dL — AB (ref 0.70–1.18)
Chloride: 101 mmol/L (ref 98–110)
GFR, Est African American: 67 mL/min/{1.73_m2} (ref >=60)
GFR, Est Non African American: 58 mL/min/{1.73_m2} — ABNORMAL LOW (ref >=60)
GLOBULIN: 2.6 g/dL (ref 1.9–3.7)
Glucose, Bld: 133 mg/dL — ABNORMAL HIGH (ref 65–99)
POTASSIUM: 4.5 mmol/L (ref 3.5–5.3)
SODIUM: 136 mmol/L (ref 135–146)
Total Bilirubin: 0.5 mg/dL (ref 0.2–1.2)
Total Protein: 7 g/dL (ref 6.1–8.1)

## 2018-03-12 LAB — HEMOGLOBIN A1C
Hgb A1c MFr Bld: 7.8 % of total Hgb — ABNORMAL HIGH (ref <5.7)
Mean Plasma Glucose: 177 (calc)
eAG (mmol/L): 9.8 (calc)

## 2018-03-25 ENCOUNTER — Other Ambulatory Visit: Payer: Self-pay

## 2018-03-25 DIAGNOSIS — G894 Chronic pain syndrome: Secondary | ICD-10-CM

## 2018-03-25 MED ORDER — HYDROCODONE-ACETAMINOPHEN 5-325 MG PO TABS: 1.0000 | ORAL_TABLET | Freq: Four times a day (QID) | ORAL | 0 refills | Status: DC | PRN

## 2018-03-25 NOTE — Telephone Encounter (Signed)
Last OV 03/11/2018 Last refill 02/20/2018 Ok to refill?

## 2018-04-24 ENCOUNTER — Other Ambulatory Visit: Payer: Self-pay | Admitting: Family Medicine

## 2018-04-24 DIAGNOSIS — G894 Chronic pain syndrome: Secondary | ICD-10-CM

## 2018-04-24 MED ORDER — SIMVASTATIN 10 MG PO TABS: ORAL_TABLET | ORAL | 1 refills | Status: DC

## 2018-04-24 MED ORDER — METFORMIN HCL 500 MG PO TABS: 500.0000 mg | ORAL_TABLET | Freq: Two times a day (BID) | ORAL | 0 refills | Status: DC

## 2018-04-24 MED ORDER — HYDROCODONE-ACETAMINOPHEN 5-325 MG PO TABS: 1.0000 | ORAL_TABLET | Freq: Four times a day (QID) | ORAL | 0 refills | Status: DC | PRN

## 2018-04-24 MED ORDER — LISINOPRIL 10 MG PO TABS: 10.0000 mg | ORAL_TABLET | Freq: Every day | ORAL | 3 refills | Status: DC

## 2018-04-24 NOTE — Telephone Encounter (Signed)
Patient is requesting a refill on Hydrocodone   LOV: 03/11/18  LRF:  03/25/18

## 2018-04-24 NOTE — Telephone Encounter (Signed)
Patient would like his lisinopril, simvastatin, and metformin sent to [place]  Would like his hydrocodone sent to [facility]

## 2018-05-02 ENCOUNTER — Other Ambulatory Visit: Payer: Self-pay

## 2018-05-02 MED ORDER — GLUCOSE BLOOD VI STRP: ORAL_STRIP | 3 refills | Status: DC

## 2018-05-06 ENCOUNTER — Other Ambulatory Visit: Payer: Self-pay | Admitting: *Deleted

## 2018-05-06 ENCOUNTER — Telehealth: Payer: Self-pay | Admitting: Physician Assistant

## 2018-05-06 DIAGNOSIS — E1165 Type 2 diabetes mellitus with hyperglycemia: Secondary | ICD-10-CM

## 2018-05-06 DIAGNOSIS — IMO0002 Reserved for concepts with insufficient information to code with codable children: Secondary | ICD-10-CM

## 2018-05-06 MED ORDER — ACCU-CHEK SOFT TOUCH LANCETS MISC: 1.0000 | Freq: Three times a day (TID) | 3 refills | Status: DC

## 2018-05-06 NOTE — Telephone Encounter (Signed)
Refill on

## 2018-05-06 NOTE — Telephone Encounter (Signed)
Which medication did he need a refill on?

## 2018-05-14 ENCOUNTER — Telehealth: Payer: Self-pay | Admitting: Family Medicine

## 2018-05-14 NOTE — Telephone Encounter (Signed)
Patient needs accu chek fast clix lancets sent to [facility]  [phone]

## 2018-05-15 MED ORDER — ACCU-CHEK FASTCLIX LANCETS MISC: 3 refills | Status: DC

## 2018-05-15 NOTE — Telephone Encounter (Signed)
Sent to pharm ....

## 2018-05-30 ENCOUNTER — Other Ambulatory Visit: Payer: Self-pay | Admitting: Family Medicine

## 2018-05-30 DIAGNOSIS — G894 Chronic pain syndrome: Secondary | ICD-10-CM

## 2018-05-30 NOTE — Telephone Encounter (Signed)
Patient is requesting a refill on Hydrocodone   LOV: 03/11/18 MBD pt  LRF:   04/24/18

## 2018-05-31 MED ORDER — HYDROCODONE-ACETAMINOPHEN 5-325 MG PO TABS: 1.0000 | ORAL_TABLET | Freq: Four times a day (QID) | ORAL | 0 refills | Status: DC | PRN

## 2018-06-13 ENCOUNTER — Ambulatory Visit: Payer: Medicare HMO | Admitting: Physician Assistant

## 2018-06-13 ENCOUNTER — Ambulatory Visit (INDEPENDENT_AMBULATORY_CARE_PROVIDER_SITE_OTHER): Payer: Medicare HMO | Admitting: Family Medicine

## 2018-06-13 ENCOUNTER — Encounter: Payer: Self-pay | Admitting: Family Medicine

## 2018-06-13 VITALS — BP 130/68 | HR 80 | Temp 98.6°F | Resp 12 | Ht 68.0 in | Wt 156.0 lb

## 2018-06-13 DIAGNOSIS — D649 Anemia, unspecified: Secondary | ICD-10-CM | POA: Diagnosis not present

## 2018-06-13 DIAGNOSIS — Z794 Long term (current) use of insulin: Secondary | ICD-10-CM | POA: Diagnosis not present

## 2018-06-13 DIAGNOSIS — I4891 Unspecified atrial fibrillation: Secondary | ICD-10-CM

## 2018-06-13 DIAGNOSIS — E119 Type 2 diabetes mellitus without complications: Secondary | ICD-10-CM

## 2018-06-13 DIAGNOSIS — E785 Hyperlipidemia, unspecified: Secondary | ICD-10-CM

## 2018-06-13 DIAGNOSIS — E038 Other specified hypothyroidism: Secondary | ICD-10-CM | POA: Diagnosis not present

## 2018-06-13 DIAGNOSIS — IMO0001 Reserved for inherently not codable concepts without codable children: Secondary | ICD-10-CM

## 2018-06-13 MED ORDER — WARFARIN SODIUM 5 MG PO TABS: 5.0000 mg | ORAL_TABLET | Freq: Every day | ORAL | 3 refills | Status: DC
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
## 2018-06-17 LAB — CBC WITH DIFFERENTIAL/PLATELET
Absolute Monocytes: 945 cells/uL (ref 200–950)
BASOS PCT: 0.6 %
Basophils Absolute: 42 cells/uL (ref 0–200)
EOS ABS: 427 {cells}/uL (ref 15–500)
Eosinophils Relative: 6.1 %
HCT: 31.2 % — ABNORMAL LOW (ref 38.5–50.0)
Hemoglobin: 10.7 g/dL — ABNORMAL LOW (ref 13.2–17.1)
Lymphs Abs: 1449 cells/uL (ref 850–3900)
MCH: 30.7 pg (ref 27.0–33.0)
MCHC: 34.3 g/dL (ref 32.0–36.0)
MCV: 89.4 fL (ref 80.0–100.0)
MPV: 10.2 fL (ref 7.5–12.5)
Monocytes Relative: 13.5 %
Neutro Abs: 4137 cells/uL (ref 1500–7800)
Neutrophils Relative %: 59.1 %
Platelets: 225 10*3/uL (ref 140–400)
RBC: 3.49 10*6/uL — ABNORMAL LOW (ref 4.20–5.80)
RDW: 13.6 % (ref 11.0–15.0)
Total Lymphocyte: 20.7 %
WBC: 7 10*3/uL (ref 3.8–10.8)

## 2018-06-17 LAB — TEST AUTHORIZATION

## 2018-06-17 LAB — COMPLETE METABOLIC PANEL WITH GFR
AG Ratio: 1.8 (calc) (ref 1.0–2.5)
ALBUMIN MSPROF: 4.2 g/dL (ref 3.6–5.1)
ALT: 11 U/L (ref 9–46)
AST: 19 U/L (ref 10–35)
Alkaline phosphatase (APISO): 63 U/L (ref 40–115)
BUN: 16 mg/dL (ref 7–25)
CO2: 23 mmol/L (ref 20–32)
Calcium: 9.2 mg/dL (ref 8.6–10.3)
Chloride: 97 mmol/L — ABNORMAL LOW (ref 98–110)
Creat: 1.18 mg/dL (ref 0.70–1.18)
GFR, Est African American: 71 mL/min/{1.73_m2} (ref >=60)
GFR, Est Non African American: 61 mL/min/{1.73_m2} (ref >=60)
GLOBULIN: 2.4 g/dL (ref 1.9–3.7)
Glucose, Bld: 109 mg/dL — ABNORMAL HIGH (ref 65–99)
Potassium: 4.2 mmol/L (ref 3.5–5.3)
Sodium: 131 mmol/L — ABNORMAL LOW (ref 135–146)
Total Bilirubin: 0.4 mg/dL (ref 0.2–1.2)
Total Protein: 6.6 g/dL (ref 6.1–8.1)

## 2018-06-17 LAB — MICROALBUMIN, URINE

## 2018-06-17 LAB — HEMOGLOBIN A1C
Hgb A1c MFr Bld: 7.9 % of total Hgb — ABNORMAL HIGH (ref <5.7)
Mean Plasma Glucose: 180 (calc)
eAG (mmol/L): 10 (calc)

## 2018-06-17 LAB — IRON: Iron: 32 ug/dL — ABNORMAL LOW (ref 50–180)

## 2018-06-17 LAB — TSH: TSH: 0.77 mIU/L (ref 0.40–4.50)

## 2018-06-18 ENCOUNTER — Other Ambulatory Visit: Payer: Medicare HMO

## 2018-06-19 ENCOUNTER — Other Ambulatory Visit: Payer: Self-pay | Admitting: Family Medicine

## 2018-06-19 DIAGNOSIS — Z0189 Encounter for other specified special examinations: Secondary | ICD-10-CM | POA: Diagnosis not present

## 2018-06-19 LAB — MICROALBUMIN, URINE: Microalb, Ur: <0.2 mg/dL

## 2018-06-26 ENCOUNTER — Other Ambulatory Visit: Payer: Self-pay | Admitting: Family Medicine

## 2018-06-26 MED ORDER — SILDENAFIL CITRATE 20 MG PO TABS: ORAL_TABLET | ORAL | 5 refills | Status: DC

## 2018-06-26 NOTE — Telephone Encounter (Signed)
Ok to refill??  Last office visit 06/13/2018.  Last refill 06/04/2017, #5 refills.

## 2018-06-27 ENCOUNTER — Other Ambulatory Visit: Payer: Self-pay | Admitting: Family Medicine

## 2018-06-27 DIAGNOSIS — G894 Chronic pain syndrome: Secondary | ICD-10-CM

## 2018-06-27 MED ORDER — HYDROCODONE-ACETAMINOPHEN 5-325 MG PO TABS: 1.0000 | ORAL_TABLET | Freq: Four times a day (QID) | ORAL | 0 refills | Status: DC | PRN

## 2018-06-27 NOTE — Telephone Encounter (Signed)
Ok to refill.  Did he decide abut warfarin?

## 2018-06-27 NOTE — Telephone Encounter (Signed)
Patient is requesting a refill on Hydrocodone   LOV: 06/13/18  LRF:   05/31/18  Also pt wanted to know what he was to do with the Coumadin? (He mailed his stool test do we are trying to locate that).  Pt has checked his bs but did not have them with him at the time of the call. He will call back with those readings.

## 2018-07-02 LAB — TEST AUTHORIZATION

## 2018-07-02 LAB — FECAL GLOBIN BY IMMUNOCHEM,MEDICARE
FECAL GLOBIN RESULT:: NOT DETECTED
MICRO NUMBER:: 115347
SPECIMEN QUALITY:: ADEQUATE

## 2018-07-03 ENCOUNTER — Telehealth: Payer: Self-pay | Admitting: Family Medicine

## 2018-07-03 MED ORDER — SILDENAFIL CITRATE 100 MG PO TABS: 50.0000 mg | ORAL_TABLET | Freq: Every day | ORAL | 2 refills | Status: DC | PRN

## 2018-07-03 NOTE — Telephone Encounter (Signed)
Stool test was negative for blood per Dr. Dennard Schaumann and pt aware of results and to start iron.

## 2018-07-03 NOTE — Telephone Encounter (Signed)
Blood Sugar Readings -  FBS   2 hours PP         89   163     113   164     117   128          74   115        229 pt had ice cream     93   192     127   149     77  Currently on Humolog 4 units at meals and Lantus 12units at night

## 2018-07-04 NOTE — Telephone Encounter (Signed)
Overall these look good, continue current dose of insulin.

## 2018-07-10 ENCOUNTER — Telehealth: Payer: Self-pay | Admitting: Family Medicine

## 2018-07-10 MED ORDER — LEVOTHYROXINE SODIUM 100 MCG PO TABS: 100.0000 ug | ORAL_TABLET | Freq: Every day | ORAL | 3 refills | Status: DC

## 2018-07-10 NOTE — Telephone Encounter (Signed)
Pharmacy:[facility]  Medication:levothyroxine 100 mcg tab  Qty:90  WNI:OEVO 1 tablet (100 MCG total)  by mouth daily  Physician:Dr. Dennard Schaumann  Patient's phone [phone]

## 2018-07-10 NOTE — Telephone Encounter (Signed)
Medication called/sent to requested pharmacy  

## 2018-07-15 ENCOUNTER — Telehealth: Payer: Self-pay | Admitting: Family Medicine

## 2018-07-15 NOTE — Telephone Encounter (Signed)
Patient aware of providers recommendations.  

## 2018-07-15 NOTE — Telephone Encounter (Signed)
Pt wanted to know if he needs to start the Coumadin now?

## 2018-07-16 NOTE — Telephone Encounter (Signed)
Then I would start Coumadin 5 mg a day and recheck INR here  in 1 week

## 2018-07-16 NOTE — Telephone Encounter (Signed)
We did get results - he mailed it to [facility] instead of [place] and it is in a phone note not under lab - it was negative for blood.

## 2018-07-16 NOTE — Telephone Encounter (Signed)
I do not see where he brought back stool test to rule out GI bleed. I would not start coumadin UNTIL we have ruled out GI bleed as a source of his anemia.  We also need to recheck his cbc one month after he has been on iron to ensure it is improving

## 2018-07-17 ENCOUNTER — Other Ambulatory Visit: Payer: Self-pay | Admitting: Family Medicine

## 2018-07-17 DIAGNOSIS — I4891 Unspecified atrial fibrillation: Secondary | ICD-10-CM

## 2018-07-17 NOTE — Telephone Encounter (Signed)
Pt aware and apt made 

## 2018-07-23 ENCOUNTER — Other Ambulatory Visit: Payer: Self-pay | Admitting: Family Medicine

## 2018-07-23 DIAGNOSIS — G894 Chronic pain syndrome: Secondary | ICD-10-CM

## 2018-07-23 MED ORDER — HYDROCODONE-ACETAMINOPHEN 5-325 MG PO TABS: 1.0000 | ORAL_TABLET | Freq: Four times a day (QID) | ORAL | 0 refills | Status: DC | PRN

## 2018-07-23 NOTE — Telephone Encounter (Signed)
Patient is requesting a refill on Hydrocodone   LOV: 06/13/2018  LRF: 06/27/2018

## 2018-07-25 ENCOUNTER — Encounter: Payer: Self-pay | Admitting: Family Medicine

## 2018-07-25 ENCOUNTER — Ambulatory Visit (INDEPENDENT_AMBULATORY_CARE_PROVIDER_SITE_OTHER): Payer: Medicare HMO | Admitting: Family Medicine

## 2018-07-25 VITALS — BP 140/70 | HR 68 | Temp 98.2°F | Resp 16 | Ht 68.0 in | Wt 154.0 lb

## 2018-07-25 DIAGNOSIS — I4891 Unspecified atrial fibrillation: Secondary | ICD-10-CM | POA: Diagnosis not present

## 2018-07-25 DIAGNOSIS — D508 Other iron deficiency anemias: Secondary | ICD-10-CM

## 2018-07-25 LAB — CBC WITH DIFFERENTIAL/PLATELET
Absolute Monocytes: 976 cells/uL — ABNORMAL HIGH (ref 200–950)
Basophils Absolute: 57 cells/uL (ref 0–200)
Basophils Relative: 0.7 %
EOS PCT: 5.1 %
Eosinophils Absolute: 418 cells/uL (ref 15–500)
HEMATOCRIT: 33.3 % — AB (ref 38.5–50.0)
Hemoglobin: 11.2 g/dL — ABNORMAL LOW (ref 13.2–17.1)
Lymphs Abs: 1820 cells/uL (ref 850–3900)
MCH: 29.9 pg (ref 27.0–33.0)
MCHC: 33.6 g/dL (ref 32.0–36.0)
MCV: 88.8 fL (ref 80.0–100.0)
MPV: 9.8 fL (ref 7.5–12.5)
Monocytes Relative: 11.9 %
NEUTROS ABS: 4928 {cells}/uL (ref 1500–7800)
Neutrophils Relative %: 60.1 %
Platelets: 235 10*3/uL (ref 140–400)
RBC: 3.75 10*6/uL — ABNORMAL LOW (ref 4.20–5.80)
RDW: 13.9 % (ref 11.0–15.0)
Total Lymphocyte: 22.2 %
WBC: 8.2 10*3/uL (ref 3.8–10.8)

## 2018-07-25 LAB — PT WITH INR/FINGERSTICK
INR, fingerstick: 1.6 ratio — ABNORMAL HIGH
PT, fingerstick: 19.7 s — ABNORMAL HIGH (ref 10.5–13.1)
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
## 2018-08-08 ENCOUNTER — Encounter: Payer: Self-pay | Admitting: Family Medicine

## 2018-08-08 ENCOUNTER — Ambulatory Visit (INDEPENDENT_AMBULATORY_CARE_PROVIDER_SITE_OTHER): Payer: Medicare HMO | Admitting: Family Medicine

## 2018-08-08 ENCOUNTER — Other Ambulatory Visit: Payer: Self-pay

## 2018-08-08 DIAGNOSIS — I4891 Unspecified atrial fibrillation: Secondary | ICD-10-CM

## 2018-08-08 LAB — PT WITH INR/FINGERSTICK
INR, fingerstick: 2.9 ratio — ABNORMAL HIGH
PT, fingerstick: 35 s — ABNORMAL HIGH (ref 10.5–13.1)

## 2018-08-08 MED ORDER — WARFARIN SODIUM 5 MG PO TABS: 10.0000 mg | ORAL_TABLET | Freq: Every day | ORAL | 3 refills | Status: DC

## 2018-08-08 MED ORDER — SILDENAFIL CITRATE 20 MG PO TABS: ORAL_TABLET | ORAL | 5 refills | Status: DC
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
## 2018-08-14 ENCOUNTER — Encounter: Payer: Self-pay | Admitting: Podiatry

## 2018-08-14 ENCOUNTER — Other Ambulatory Visit: Payer: Self-pay

## 2018-08-14 ENCOUNTER — Ambulatory Visit: Payer: Medicare HMO | Admitting: Podiatry

## 2018-08-14 DIAGNOSIS — M79676 Pain in unspecified toe(s): Secondary | ICD-10-CM

## 2018-08-14 DIAGNOSIS — E1140 Type 2 diabetes mellitus with diabetic neuropathy, unspecified: Secondary | ICD-10-CM

## 2018-08-14 DIAGNOSIS — E1149 Type 2 diabetes mellitus with other diabetic neurological complication: Secondary | ICD-10-CM | POA: Diagnosis not present

## 2018-08-14 DIAGNOSIS — B351 Tinea unguium: Secondary | ICD-10-CM

## 2018-08-14 NOTE — Progress Notes (Signed)
He presents today chief complaint of painful elongated toenails bilaterally.  Objective: Toenails are long thick yellow dystrophic-like mycotic.  Hallux varus is noted bilateral.  Assessment: Pain limb secondary onychomycosis.  Plan: Debridement of toenails.

## 2018-08-20 ENCOUNTER — Other Ambulatory Visit: Payer: Self-pay

## 2018-08-20 DIAGNOSIS — G894 Chronic pain syndrome: Secondary | ICD-10-CM

## 2018-08-20 NOTE — Telephone Encounter (Signed)
Requested Prescriptions   Pending Prescriptions Disp Refills  . HYDROcodone-acetaminophen (NORCO/VICODIN) 5-325 MG tablet 90 tablet 0    Sig: Take 1 tablet by mouth every 6 (six) hours as needed for moderate pain.   Last OV 08/08/2018  Last filled 07/23/2018

## 2018-08-21 MED ORDER — HYDROCODONE-ACETAMINOPHEN 5-325 MG PO TABS: 1.0000 | ORAL_TABLET | Freq: Four times a day (QID) | ORAL | 0 refills | Status: DC | PRN

## 2018-09-19 ENCOUNTER — Ambulatory Visit: Payer: Medicare HMO | Admitting: Family Medicine

## 2018-09-20 ENCOUNTER — Other Ambulatory Visit: Payer: Self-pay

## 2018-09-20 ENCOUNTER — Encounter: Payer: Self-pay | Admitting: Family Medicine

## 2018-09-20 ENCOUNTER — Ambulatory Visit (INDEPENDENT_AMBULATORY_CARE_PROVIDER_SITE_OTHER): Payer: Medicare HMO | Admitting: Family Medicine

## 2018-09-20 DIAGNOSIS — I4891 Unspecified atrial fibrillation: Secondary | ICD-10-CM

## 2018-09-20 LAB — PT WITH INR/FINGERSTICK
INR, fingerstick: 2 ratio — ABNORMAL HIGH
PT, fingerstick: 24.1 s — ABNORMAL HIGH (ref 10.5–13.1)

## 2018-09-20 MED ORDER — SILVER SULFADIAZINE 1 % EX CREA: 1.0000 "application " | TOPICAL_CREAM | Freq: Every day | CUTANEOUS | 0 refills | Status: DC
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
## 2018-09-23 ENCOUNTER — Ambulatory Visit: Payer: Medicare HMO | Admitting: Family Medicine

## 2018-09-23 ENCOUNTER — Other Ambulatory Visit: Payer: Self-pay

## 2018-09-23 ENCOUNTER — Other Ambulatory Visit: Payer: Self-pay | Admitting: Family Medicine

## 2018-09-23 VITALS — BP 150/50

## 2018-09-23 DIAGNOSIS — G894 Chronic pain syndrome: Secondary | ICD-10-CM

## 2018-09-23 DIAGNOSIS — I10 Essential (primary) hypertension: Secondary | ICD-10-CM

## 2018-09-23 MED ORDER — HYDROCODONE-ACETAMINOPHEN 5-325 MG PO TABS: 1.0000 | ORAL_TABLET | Freq: Four times a day (QID) | ORAL | 0 refills | Status: DC | PRN

## 2018-10-09 ENCOUNTER — Other Ambulatory Visit: Payer: Self-pay | Admitting: Family Medicine

## 2018-10-09 DIAGNOSIS — I4891 Unspecified atrial fibrillation: Secondary | ICD-10-CM

## 2018-10-09 MED ORDER — DILTIAZEM HCL ER COATED BEADS 120 MG PO CP24: 120.0000 mg | ORAL_CAPSULE | Freq: Every day | ORAL | 3 refills | Status: DC

## 2018-10-14 ENCOUNTER — Other Ambulatory Visit: Payer: Self-pay | Admitting: Family Medicine

## 2018-10-14 MED ORDER — INSULIN ASPART 100 UNIT/ML ~~LOC~~ SOLN: SUBCUTANEOUS | 3 refills | Status: DC

## 2018-10-22 ENCOUNTER — Other Ambulatory Visit: Payer: Self-pay | Admitting: Family Medicine

## 2018-10-22 DIAGNOSIS — G894 Chronic pain syndrome: Secondary | ICD-10-CM

## 2018-10-22 MED ORDER — SIMVASTATIN 10 MG PO TABS: ORAL_TABLET | ORAL | 1 refills | Status: DC

## 2018-10-22 MED ORDER — HYDROCODONE-ACETAMINOPHEN 5-325 MG PO TABS: 1.0000 | ORAL_TABLET | Freq: Four times a day (QID) | ORAL | 0 refills | Status: DC | PRN

## 2018-10-22 NOTE — Telephone Encounter (Signed)
Patient is requesting a refill on Hydrocodone   LOV: 07/22/2018  LRF: 09/23/18

## 2018-10-29 ENCOUNTER — Telehealth: Payer: Self-pay

## 2018-10-29 NOTE — Telephone Encounter (Signed)
Left a detailed message for the patient to contact our office to schedule a virtual visit with Christell Faith, PA tomorrow or Dr. Fletcher Anon for a future appointment if he doesn't want to do a visit with Christell Faith, PA.

## 2018-11-05 ENCOUNTER — Ambulatory Visit (INDEPENDENT_AMBULATORY_CARE_PROVIDER_SITE_OTHER): Payer: Medicare HMO | Admitting: Family Medicine

## 2018-11-05 ENCOUNTER — Other Ambulatory Visit: Payer: Self-pay

## 2018-11-05 ENCOUNTER — Encounter: Payer: Self-pay | Admitting: Family Medicine

## 2018-11-05 VITALS — BP 140/84 | HR 76 | Temp 99.0°F | Resp 16 | Ht 68.0 in | Wt 152.0 lb

## 2018-11-05 DIAGNOSIS — I4891 Unspecified atrial fibrillation: Secondary | ICD-10-CM

## 2018-11-05 DIAGNOSIS — Z794 Long term (current) use of insulin: Secondary | ICD-10-CM

## 2018-11-05 DIAGNOSIS — E119 Type 2 diabetes mellitus without complications: Secondary | ICD-10-CM | POA: Diagnosis not present

## 2018-11-05 DIAGNOSIS — IMO0001 Reserved for inherently not codable concepts without codable children: Secondary | ICD-10-CM

## 2018-11-05 LAB — PT WITH INR/FINGERSTICK
INR, fingerstick: 1.8 ratio — ABNORMAL HIGH
PT, fingerstick: 22 s — ABNORMAL HIGH (ref 10.5–13.1)
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
## 2018-11-06 LAB — COMPLETE METABOLIC PANEL WITH GFR
AG Ratio: 1.6 (calc) (ref 1.0–2.5)
ALT: 10 U/L (ref 9–46)
AST: 17 U/L (ref 10–35)
Albumin: 4.2 g/dL (ref 3.6–5.1)
Alkaline phosphatase (APISO): 64 U/L (ref 35–144)
BUN: 20 mg/dL (ref 7–25)
CO2: 21 mmol/L (ref 20–32)
Calcium: 8.9 mg/dL (ref 8.6–10.3)
Chloride: 101 mmol/L (ref 98–110)
Creat: 1.17 mg/dL (ref 0.70–1.18)
GFR, Est African American: 71 mL/min/{1.73_m2} (ref >=60)
GFR, Est Non African American: 61 mL/min/{1.73_m2} (ref >=60)
Globulin: 2.6 g/dL (calc) (ref 1.9–3.7)
Glucose, Bld: 101 mg/dL — ABNORMAL HIGH (ref 65–99)
Potassium: 4.1 mmol/L (ref 3.5–5.3)
Sodium: 135 mmol/L (ref 135–146)
Total Bilirubin: 0.5 mg/dL (ref 0.2–1.2)
Total Protein: 6.8 g/dL (ref 6.1–8.1)

## 2018-11-06 LAB — HEMOGLOBIN A1C
Hgb A1c MFr Bld: 8.1 % of total Hgb — ABNORMAL HIGH (ref <5.7)
Mean Plasma Glucose: 186 (calc)
eAG (mmol/L): 10.3 (calc)

## 2018-11-06 LAB — LIPID PANEL
Cholesterol: 141 mg/dL (ref <200)
HDL: 49 mg/dL (ref >=40)
LDL Cholesterol (Calc): 76 mg/dL (calc)
Non-HDL Cholesterol (Calc): 92 mg/dL (calc) (ref <130)
Total CHOL/HDL Ratio: 2.9 (calc) (ref <5.0)
Triglycerides: 76 mg/dL (ref <150)

## 2018-11-11 ENCOUNTER — Other Ambulatory Visit: Payer: Self-pay

## 2018-11-11 ENCOUNTER — Encounter: Payer: Self-pay | Admitting: Podiatry

## 2018-11-11 ENCOUNTER — Ambulatory Visit: Payer: Medicare HMO | Admitting: Podiatry

## 2018-11-11 DIAGNOSIS — Z794 Long term (current) use of insulin: Secondary | ICD-10-CM

## 2018-11-11 DIAGNOSIS — M79674 Pain in right toe(s): Secondary | ICD-10-CM

## 2018-11-11 DIAGNOSIS — E0840 Diabetes mellitus due to underlying condition with diabetic neuropathy, unspecified: Secondary | ICD-10-CM | POA: Diagnosis not present

## 2018-11-11 DIAGNOSIS — B351 Tinea unguium: Secondary | ICD-10-CM

## 2018-11-11 DIAGNOSIS — M79675 Pain in left toe(s): Secondary | ICD-10-CM

## 2018-11-11 NOTE — Progress Notes (Signed)
Complaint:  Visit Type: Patient returns to my office for continued preventative foot care services. Complaint: Patient states" my nails have grown long and thick and become painful to walk and wear shoes" Patient has been diagnosed with DM with neuropathy. The patient presents for preventative foot care services. No changes to ROS  Podiatric Exam: Vascular: dorsalis pedis and posterior tibial pulses are palpable bilateral. Capillary return is immediate. Temperature gradient is WNL. Skin turgor WNL  Sensorium: Diminished/absent  Semmes Weinstein monofilament test. Diminished/absent tactile sensation bilaterally. Nail Exam: Pt has thick disfigured discolored nails with subungual debris noted bilateral entire nail hallux through fifth toenails Ulcer Exam: There is no evidence of ulcer or pre-ulcerative changes or infection. Orthopedic Exam: Muscle tone and strength are WNL. No limitations in general ROM. No crepitus or effusions noted. Foot type and digits show no abnormalities. Hallux varus  B/l. Skin: No Porokeratosis. No infection or ulcers.  Asymptomatic callus sub 1 left foot.  Diagnosis:  Onychomycosis, , Pain in right toe, pain in left toes  Treatment & Plan Procedures and Treatment: Consent by patient was obtained for treatment procedures.   Debridement of mycotic and hypertrophic toenails, 1 through 5 bilateral and clearing of subungual debris. No ulceration, no infection noted.  Return Visit-Office Procedure: Patient instructed to return to the office for a follow up visit 3 months for continued evaluation and treatment.    Gardiner Barefoot DPM

## 2018-11-25 ENCOUNTER — Telehealth: Payer: Self-pay | Admitting: Family Medicine

## 2018-11-25 ENCOUNTER — Other Ambulatory Visit: Payer: Self-pay | Admitting: Family Medicine

## 2018-11-25 DIAGNOSIS — G894 Chronic pain syndrome: Secondary | ICD-10-CM

## 2018-11-25 MED ORDER — HYDROCODONE-ACETAMINOPHEN 5-325 MG PO TABS: 1.0000 | ORAL_TABLET | Freq: Four times a day (QID) | ORAL | 0 refills | Status: DC | PRN

## 2018-11-25 NOTE — Telephone Encounter (Signed)
BS reading :  FBS   2 hours PP   125   106,224   76   150,118,154   89   171,58,176,160   103   222,128,93,115   111   134,123,184  Per Dr. Dennard Schaumann sugars are acceptable - no changes in medications.   Pt aware via vm

## 2018-11-25 NOTE — Telephone Encounter (Signed)
Patient is requesting a refill on Hydrocodone   LOV: 11/05/18  LRF:   10/22/18

## 2018-11-28 ENCOUNTER — Other Ambulatory Visit: Payer: Self-pay | Admitting: Family Medicine

## 2018-11-28 MED ORDER — ACCU-CHEK FASTCLIX LANCETS MISC: 3 refills | Status: DC

## 2018-11-28 MED ORDER — ACCU-CHEK AVIVA PLUS W/DEVICE KIT: PACK | 0 refills | Status: DC

## 2018-12-03 ENCOUNTER — Other Ambulatory Visit: Payer: Self-pay | Admitting: *Deleted

## 2018-12-05 ENCOUNTER — Other Ambulatory Visit: Payer: Self-pay

## 2018-12-05 ENCOUNTER — Ambulatory Visit (INDEPENDENT_AMBULATORY_CARE_PROVIDER_SITE_OTHER): Payer: Medicare HMO | Admitting: Family Medicine

## 2018-12-05 ENCOUNTER — Encounter: Payer: Self-pay | Admitting: Family Medicine

## 2018-12-05 DIAGNOSIS — I4891 Unspecified atrial fibrillation: Secondary | ICD-10-CM

## 2018-12-05 DIAGNOSIS — E11649 Type 2 diabetes mellitus with hypoglycemia without coma: Secondary | ICD-10-CM | POA: Diagnosis not present

## 2018-12-05 LAB — PT WITH INR/FINGERSTICK
INR, fingerstick: 3.2 ratio — ABNORMAL HIGH
PT, fingerstick: 37.9 s — ABNORMAL HIGH (ref 10.5–13.1)

## 2018-12-05 MED ORDER — ACCU-CHEK FASTCLIX LANCET KIT: PACK | 0 refills | Status: AC

## 2018-12-05 MED ORDER — GLUCAGON EMERGENCY 1 MG IJ KIT: 1.0000 mg | PACK | Freq: Once | INTRAMUSCULAR | 12 refills | Status: AC | PRN
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
## 2018-12-20 ENCOUNTER — Other Ambulatory Visit: Payer: Self-pay | Admitting: Family Medicine

## 2018-12-20 DIAGNOSIS — G894 Chronic pain syndrome: Secondary | ICD-10-CM

## 2018-12-20 MED ORDER — HYDROCODONE-ACETAMINOPHEN 5-325 MG PO TABS: 1.0000 | ORAL_TABLET | Freq: Four times a day (QID) | ORAL | 0 refills | Status: DC | PRN

## 2018-12-20 NOTE — Telephone Encounter (Signed)
Patient requesting a refill on Oxycodone     LOV:  12/05/2018  LRF:    11/25/18

## 2018-12-27 ENCOUNTER — Other Ambulatory Visit: Payer: Self-pay | Admitting: Family Medicine

## 2018-12-27 MED ORDER — METFORMIN HCL 500 MG PO TABS: ORAL_TABLET | ORAL | 2 refills | Status: DC

## 2019-01-16 ENCOUNTER — Other Ambulatory Visit: Payer: Self-pay | Admitting: Family Medicine

## 2019-01-16 ENCOUNTER — Other Ambulatory Visit: Payer: Self-pay

## 2019-01-16 ENCOUNTER — Ambulatory Visit (INDEPENDENT_AMBULATORY_CARE_PROVIDER_SITE_OTHER): Payer: Medicare HMO | Admitting: Family Medicine

## 2019-01-16 ENCOUNTER — Encounter: Payer: Self-pay | Admitting: Family Medicine

## 2019-01-16 VITALS — BP 130/60 | HR 61 | Temp 99.5°F | Resp 14 | Ht 68.0 in | Wt 152.0 lb

## 2019-01-16 DIAGNOSIS — I480 Paroxysmal atrial fibrillation: Secondary | ICD-10-CM

## 2019-01-16 DIAGNOSIS — I4891 Unspecified atrial fibrillation: Secondary | ICD-10-CM | POA: Diagnosis not present

## 2019-01-16 LAB — PT WITH INR/FINGERSTICK
INR, fingerstick: 2.6 ratio — ABNORMAL HIGH
PT, fingerstick: 30.7 s — ABNORMAL HIGH (ref 10.5–13.1)

## 2019-01-16 MED ORDER — WARFARIN SODIUM 5 MG PO TABS: 10.0000 mg | ORAL_TABLET | Freq: Every day | ORAL | 3 refills | Status: DC
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
## 2019-01-27 ENCOUNTER — Other Ambulatory Visit: Payer: Self-pay | Admitting: Family Medicine

## 2019-01-27 DIAGNOSIS — G894 Chronic pain syndrome: Secondary | ICD-10-CM

## 2019-01-27 MED ORDER — HYDROCODONE-ACETAMINOPHEN 5-325 MG PO TABS: 1.0000 | ORAL_TABLET | Freq: Four times a day (QID) | ORAL | 0 refills | Status: DC | PRN

## 2019-01-27 NOTE — Telephone Encounter (Signed)
Patient is requesting a refill on Hydrocodone   LOV:01/16/19  LRF:   12/20/18

## 2019-02-10 ENCOUNTER — Ambulatory Visit: Payer: Medicare HMO | Admitting: Podiatry

## 2019-02-10 ENCOUNTER — Telehealth: Payer: Self-pay | Admitting: Family Medicine

## 2019-02-10 NOTE — Telephone Encounter (Signed)
Patient requesting a refill on his sildenafil 20 mg [facility]. [place]  CB# [phone]

## 2019-02-11 MED ORDER — SILDENAFIL CITRATE 100 MG PO TABS: 50.0000 mg | ORAL_TABLET | Freq: Every day | ORAL | 2 refills | Status: DC | PRN

## 2019-02-11 NOTE — Telephone Encounter (Signed)
Medication called/sent to requested pharmacy  

## 2019-02-24 ENCOUNTER — Other Ambulatory Visit: Payer: Self-pay | Admitting: Family Medicine

## 2019-02-24 DIAGNOSIS — G894 Chronic pain syndrome: Secondary | ICD-10-CM

## 2019-02-24 MED ORDER — ACCU-CHEK AVIVA PLUS VI STRP: ORAL_STRIP | 3 refills | Status: DC

## 2019-02-24 NOTE — Telephone Encounter (Signed)
Patient is requesting a refill on Hydrocodone   LOV: 01/16/2019  LRF:   01/27/2019

## 2019-02-25 MED ORDER — HYDROCODONE-ACETAMINOPHEN 5-325 MG PO TABS: 1.0000 | ORAL_TABLET | Freq: Four times a day (QID) | ORAL | 0 refills | Status: DC | PRN

## 2019-02-27 ENCOUNTER — Ambulatory Visit (INDEPENDENT_AMBULATORY_CARE_PROVIDER_SITE_OTHER): Payer: Medicare HMO | Admitting: Family Medicine

## 2019-02-27 ENCOUNTER — Encounter: Payer: Self-pay | Admitting: Family Medicine

## 2019-02-27 ENCOUNTER — Other Ambulatory Visit: Payer: Self-pay

## 2019-02-27 VITALS — BP 130/62 | HR 78 | Temp 97.6°F | Resp 16 | Ht 68.0 in | Wt 156.0 lb

## 2019-02-27 DIAGNOSIS — M25511 Pain in right shoulder: Secondary | ICD-10-CM

## 2019-02-27 DIAGNOSIS — G8929 Other chronic pain: Secondary | ICD-10-CM

## 2019-02-27 DIAGNOSIS — I4891 Unspecified atrial fibrillation: Secondary | ICD-10-CM | POA: Diagnosis not present

## 2019-02-27 LAB — PT WITH INR/FINGERSTICK
INR, fingerstick: 2.2 ratio — ABNORMAL HIGH
PT, fingerstick: 26.8 s — ABNORMAL HIGH (ref 10.5–13.1)
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
## 2019-03-13 ENCOUNTER — Other Ambulatory Visit: Payer: Self-pay | Admitting: Family Medicine

## 2019-03-18 ENCOUNTER — Other Ambulatory Visit: Payer: Self-pay | Admitting: Family Medicine

## 2019-03-22 ENCOUNTER — Other Ambulatory Visit: Payer: Self-pay | Admitting: Family Medicine

## 2019-03-28 ENCOUNTER — Other Ambulatory Visit: Payer: Self-pay | Admitting: Family Medicine

## 2019-03-28 DIAGNOSIS — G894 Chronic pain syndrome: Secondary | ICD-10-CM

## 2019-03-28 MED ORDER — HYDROCODONE-ACETAMINOPHEN 5-325 MG PO TABS: 1.0000 | ORAL_TABLET | Freq: Four times a day (QID) | ORAL | 0 refills | Status: DC | PRN

## 2019-03-28 NOTE — Telephone Encounter (Signed)
Patient is requesting a refill on Hydrocodone   LOV: 02/27/19  LRF:   02/25/19

## 2019-04-10 ENCOUNTER — Ambulatory Visit: Payer: Medicare HMO | Admitting: Family Medicine

## 2019-04-28 ENCOUNTER — Other Ambulatory Visit: Payer: Self-pay | Admitting: Family Medicine

## 2019-04-28 MED ORDER — ACCU-CHEK AVIVA PLUS VI STRP: ORAL_STRIP | 3 refills | Status: DC

## 2019-05-06 ENCOUNTER — Other Ambulatory Visit: Payer: Self-pay | Admitting: Family Medicine

## 2019-05-06 DIAGNOSIS — G894 Chronic pain syndrome: Secondary | ICD-10-CM

## 2019-05-06 MED ORDER — HYDROCODONE-ACETAMINOPHEN 5-325 MG PO TABS: 1.0000 | ORAL_TABLET | Freq: Four times a day (QID) | ORAL | 0 refills | Status: DC | PRN

## 2019-05-06 NOTE — Telephone Encounter (Signed)
Patient is requesting a refill on Hydrocodone   LOV: 02/27/2019  LRF:   03/28/19

## 2019-05-13 ENCOUNTER — Ambulatory Visit (INDEPENDENT_AMBULATORY_CARE_PROVIDER_SITE_OTHER): Payer: Medicare HMO | Admitting: Family Medicine

## 2019-05-13 ENCOUNTER — Encounter: Payer: Self-pay | Admitting: Family Medicine

## 2019-05-13 ENCOUNTER — Other Ambulatory Visit: Payer: Self-pay

## 2019-05-13 VITALS — BP 136/76 | HR 80 | Temp 96.8°F | Resp 16 | Ht 68.0 in | Wt 152.0 lb

## 2019-05-13 DIAGNOSIS — E038 Other specified hypothyroidism: Secondary | ICD-10-CM | POA: Diagnosis not present

## 2019-05-13 DIAGNOSIS — E785 Hyperlipidemia, unspecified: Secondary | ICD-10-CM | POA: Diagnosis not present

## 2019-05-13 DIAGNOSIS — Z794 Long term (current) use of insulin: Secondary | ICD-10-CM | POA: Diagnosis not present

## 2019-05-13 DIAGNOSIS — I4891 Unspecified atrial fibrillation: Secondary | ICD-10-CM | POA: Diagnosis not present

## 2019-05-13 DIAGNOSIS — E119 Type 2 diabetes mellitus without complications: Secondary | ICD-10-CM

## 2019-05-13 LAB — PT WITH INR/FINGERSTICK
INR, fingerstick: 2.1 ratio — ABNORMAL HIGH
PT, fingerstick: 25.4 s — ABNORMAL HIGH (ref 10.5–13.1)

## 2019-05-13 MED ORDER — CYCLOBENZAPRINE HCL 10 MG PO TABS: 10.0000 mg | ORAL_TABLET | Freq: Every day | ORAL | 1 refills | Status: DC
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
## 2019-05-14 LAB — COMPLETE METABOLIC PANEL WITH GFR
AG Ratio: 1.9 (calc) (ref 1.0–2.5)
ALT: 17 U/L (ref 9–46)
AST: 21 U/L (ref 10–35)
Albumin: 4.6 g/dL (ref 3.6–5.1)
Alkaline phosphatase (APISO): 52 U/L (ref 35–144)
BUN: 14 mg/dL (ref 7–25)
CO2: 25 mmol/L (ref 20–32)
Calcium: 9.7 mg/dL (ref 8.6–10.3)
Chloride: 99 mmol/L (ref 98–110)
Creat: 1.15 mg/dL (ref 0.70–1.18)
GFR, Est African American: 73 mL/min/{1.73_m2} (ref >=60)
GFR, Est Non African American: 63 mL/min/{1.73_m2} (ref >=60)
Globulin: 2.4 g/dL (calc) (ref 1.9–3.7)
Glucose, Bld: 119 mg/dL — ABNORMAL HIGH (ref 65–99)
Potassium: 4.6 mmol/L (ref 3.5–5.3)
Sodium: 135 mmol/L (ref 135–146)
Total Bilirubin: 0.7 mg/dL (ref 0.2–1.2)
Total Protein: 7 g/dL (ref 6.1–8.1)

## 2019-05-14 LAB — CBC WITH DIFFERENTIAL/PLATELET
Absolute Monocytes: 902 cells/uL (ref 200–950)
Basophils Absolute: 49 cells/uL (ref 0–200)
Basophils Relative: 0.5 %
Eosinophils Absolute: 291 cells/uL (ref 15–500)
Eosinophils Relative: 3 %
HCT: 35.6 % — ABNORMAL LOW (ref 38.5–50.0)
Hemoglobin: 12.2 g/dL — ABNORMAL LOW (ref 13.2–17.1)
Lymphs Abs: 1436 cells/uL (ref 850–3900)
MCH: 32 pg (ref 27.0–33.0)
MCHC: 34.3 g/dL (ref 32.0–36.0)
MCV: 93.4 fL (ref 80.0–100.0)
MPV: 9.6 fL (ref 7.5–12.5)
Monocytes Relative: 9.3 %
Neutro Abs: 7023 cells/uL (ref 1500–7800)
Neutrophils Relative %: 72.4 %
Platelets: 258 10*3/uL (ref 140–400)
RBC: 3.81 10*6/uL — ABNORMAL LOW (ref 4.20–5.80)
RDW: 13.9 % (ref 11.0–15.0)
Total Lymphocyte: 14.8 %
WBC: 9.7 10*3/uL (ref 3.8–10.8)

## 2019-05-14 LAB — LIPID PANEL
Cholesterol: 156 mg/dL (ref <200)
HDL: 64 mg/dL (ref >=40)
LDL Cholesterol (Calc): 79 mg/dL (calc)
Non-HDL Cholesterol (Calc): 92 mg/dL (calc) (ref <130)
Total CHOL/HDL Ratio: 2.4 (calc) (ref <5.0)
Triglycerides: 58 mg/dL (ref <150)

## 2019-05-14 LAB — TSH: TSH: 1.29 mIU/L (ref 0.40–4.50)

## 2019-05-14 LAB — HEMOGLOBIN A1C
Hgb A1c MFr Bld: 8.6 % of total Hgb — ABNORMAL HIGH (ref <5.7)
Mean Plasma Glucose: 200 (calc)
eAG (mmol/L): 11.1 (calc)

## 2019-05-16 ENCOUNTER — Other Ambulatory Visit: Payer: Self-pay

## 2019-05-16 MED ORDER — ACCU-CHEK AVIVA PLUS VI STRP: ORAL_STRIP | 3 refills | Status: DC

## 2019-05-16 MED ORDER — PIOGLITAZONE HCL 15 MG PO TABS: 15.0000 mg | ORAL_TABLET | Freq: Every day | ORAL | 2 refills | Status: DC

## 2019-05-30 ENCOUNTER — Other Ambulatory Visit: Payer: Self-pay | Admitting: Family Medicine

## 2019-06-10 ENCOUNTER — Other Ambulatory Visit: Payer: Self-pay | Admitting: Family Medicine

## 2019-06-10 DIAGNOSIS — G894 Chronic pain syndrome: Secondary | ICD-10-CM

## 2019-06-10 MED ORDER — HYDROCODONE-ACETAMINOPHEN 5-325 MG PO TABS: 1.0000 | ORAL_TABLET | Freq: Four times a day (QID) | ORAL | 0 refills | Status: DC | PRN

## 2019-06-10 NOTE — Telephone Encounter (Signed)
Patient is requesting a refill on Hydrocodone   LOV: 05/13/2019  LRF:   05/06/19

## 2019-06-24 ENCOUNTER — Encounter: Payer: Self-pay | Admitting: Family Medicine

## 2019-06-24 ENCOUNTER — Ambulatory Visit (INDEPENDENT_AMBULATORY_CARE_PROVIDER_SITE_OTHER): Payer: Medicare HMO | Admitting: Family Medicine

## 2019-06-24 ENCOUNTER — Other Ambulatory Visit: Payer: Self-pay

## 2019-06-24 VITALS — BP 138/78 | HR 58 | Temp 97.6°F | Resp 14 | Ht 68.0 in | Wt 156.0 lb

## 2019-06-24 DIAGNOSIS — I4891 Unspecified atrial fibrillation: Secondary | ICD-10-CM | POA: Diagnosis not present

## 2019-06-24 DIAGNOSIS — E119 Type 2 diabetes mellitus without complications: Secondary | ICD-10-CM | POA: Diagnosis not present

## 2019-06-24 DIAGNOSIS — G8929 Other chronic pain: Secondary | ICD-10-CM

## 2019-06-24 DIAGNOSIS — Z794 Long term (current) use of insulin: Secondary | ICD-10-CM | POA: Diagnosis not present

## 2019-06-24 DIAGNOSIS — M25511 Pain in right shoulder: Secondary | ICD-10-CM

## 2019-06-24 LAB — PT WITH INR/FINGERSTICK
INR, fingerstick: 2 ratio — ABNORMAL HIGH
PT, fingerstick: 23.7 s — ABNORMAL HIGH (ref 10.5–13.1)
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
## 2019-07-10 ENCOUNTER — Other Ambulatory Visit: Payer: Self-pay | Admitting: Family Medicine

## 2019-07-10 DIAGNOSIS — G894 Chronic pain syndrome: Secondary | ICD-10-CM

## 2019-07-10 NOTE — Telephone Encounter (Signed)
Patient is requesting a refill on Hydrocodone   LOV:  06/24/19  LRF:   06/10/19

## 2019-07-11 MED ORDER — HYDROCODONE-ACETAMINOPHEN 5-325 MG PO TABS: 1.0000 | ORAL_TABLET | Freq: Four times a day (QID) | ORAL | 0 refills | Status: DC | PRN

## 2019-08-05 ENCOUNTER — Ambulatory Visit (INDEPENDENT_AMBULATORY_CARE_PROVIDER_SITE_OTHER): Payer: Medicare HMO | Admitting: Family Medicine

## 2019-08-05 ENCOUNTER — Other Ambulatory Visit: Payer: Self-pay

## 2019-08-05 VITALS — BP 160/82 | HR 56 | Temp 98.0°F | Resp 18 | Ht 68.0 in | Wt 156.0 lb

## 2019-08-05 DIAGNOSIS — E119 Type 2 diabetes mellitus without complications: Secondary | ICD-10-CM

## 2019-08-05 DIAGNOSIS — Z794 Long term (current) use of insulin: Secondary | ICD-10-CM | POA: Diagnosis not present

## 2019-08-05 DIAGNOSIS — G8929 Other chronic pain: Secondary | ICD-10-CM | POA: Diagnosis not present

## 2019-08-05 DIAGNOSIS — M25511 Pain in right shoulder: Secondary | ICD-10-CM | POA: Diagnosis not present

## 2019-08-05 DIAGNOSIS — Z5181 Encounter for therapeutic drug level monitoring: Secondary | ICD-10-CM | POA: Diagnosis not present

## 2019-08-05 DIAGNOSIS — I4891 Unspecified atrial fibrillation: Secondary | ICD-10-CM | POA: Diagnosis not present

## 2019-08-05 DIAGNOSIS — I10 Essential (primary) hypertension: Secondary | ICD-10-CM

## 2019-08-05 LAB — PT WITH INR/FINGERSTICK
INR, fingerstick: 1.7 ratio — ABNORMAL HIGH
PT, fingerstick: 20.8 s — ABNORMAL HIGH (ref 10.5–13.1)

## 2019-08-05 MED ORDER — LISINOPRIL 20 MG PO TABS: 20.0000 mg | ORAL_TABLET | Freq: Every day | ORAL | 3 refills | Status: DC
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
## 2019-08-06 ENCOUNTER — Other Ambulatory Visit: Payer: Self-pay

## 2019-08-06 DIAGNOSIS — G894 Chronic pain syndrome: Secondary | ICD-10-CM

## 2019-08-06 NOTE — Telephone Encounter (Signed)
Requested Prescriptions   Pending Prescriptions Disp Refills  . HYDROcodone-acetaminophen (NORCO/VICODIN) 5-325 MG tablet 90 tablet 0    Sig: Take 1 tablet by mouth every 6 (six) hours as needed for moderate pain.     Last OV 08/05/2019  Last written 07/11/2019

## 2019-08-07 MED ORDER — HYDROCODONE-ACETAMINOPHEN 5-325 MG PO TABS: 1.0000 | ORAL_TABLET | Freq: Four times a day (QID) | ORAL | 0 refills | Status: DC | PRN

## 2019-08-14 ENCOUNTER — Other Ambulatory Visit: Payer: Self-pay

## 2019-08-14 ENCOUNTER — Other Ambulatory Visit: Payer: Medicare HMO

## 2019-08-14 DIAGNOSIS — I10 Essential (primary) hypertension: Secondary | ICD-10-CM | POA: Diagnosis not present

## 2019-08-14 DIAGNOSIS — E785 Hyperlipidemia, unspecified: Secondary | ICD-10-CM

## 2019-08-14 DIAGNOSIS — Z794 Long term (current) use of insulin: Secondary | ICD-10-CM

## 2019-08-14 DIAGNOSIS — E119 Type 2 diabetes mellitus without complications: Secondary | ICD-10-CM

## 2019-08-15 LAB — CBC WITH DIFFERENTIAL/PLATELET
Absolute Monocytes: 999 cells/uL — ABNORMAL HIGH (ref 200–950)
Basophils Absolute: 52 cells/uL (ref 0–200)
Basophils Relative: 0.7 %
Eosinophils Absolute: 170 cells/uL (ref 15–500)
Eosinophils Relative: 2.3 %
HCT: 35.5 % — ABNORMAL LOW (ref 38.5–50.0)
Hemoglobin: 12.1 g/dL — ABNORMAL LOW (ref 13.2–17.1)
Lymphs Abs: 1236 cells/uL (ref 850–3900)
MCH: 31.8 pg (ref 27.0–33.0)
MCHC: 34.1 g/dL (ref 32.0–36.0)
MCV: 93.2 fL (ref 80.0–100.0)
MPV: 10.2 fL (ref 7.5–12.5)
Monocytes Relative: 13.5 %
Neutro Abs: 4943 cells/uL (ref 1500–7800)
Neutrophils Relative %: 66.8 %
Platelets: 243 10*3/uL (ref 140–400)
RBC: 3.81 10*6/uL — ABNORMAL LOW (ref 4.20–5.80)
RDW: 12.5 % (ref 11.0–15.0)
Total Lymphocyte: 16.7 %
WBC: 7.4 10*3/uL (ref 3.8–10.8)

## 2019-08-15 LAB — LIPID PANEL
Cholesterol: 143 mg/dL (ref <200)
HDL: 53 mg/dL (ref >=40)
LDL Cholesterol (Calc): 76 mg/dL (calc)
Non-HDL Cholesterol (Calc): 90 mg/dL (calc) (ref <130)
Total CHOL/HDL Ratio: 2.7 (calc) (ref <5.0)
Triglycerides: 63 mg/dL (ref <150)

## 2019-08-15 LAB — HEMOGLOBIN A1C
Hgb A1c MFr Bld: 8.1 % of total Hgb — ABNORMAL HIGH (ref <5.7)
Mean Plasma Glucose: 186 (calc)
eAG (mmol/L): 10.3 (calc)

## 2019-08-15 LAB — COMPREHENSIVE METABOLIC PANEL
AG Ratio: 1.9 (calc) (ref 1.0–2.5)
ALT: 13 U/L (ref 9–46)
AST: 17 U/L (ref 10–35)
Albumin: 4.3 g/dL (ref 3.6–5.1)
Alkaline phosphatase (APISO): 53 U/L (ref 35–144)
BUN/Creatinine Ratio: 16 (calc) (ref 6–22)
BUN: 20 mg/dL (ref 7–25)
CO2: 27 mmol/L (ref 20–32)
Calcium: 9.4 mg/dL (ref 8.6–10.3)
Chloride: 105 mmol/L (ref 98–110)
Creat: 1.25 mg/dL — ABNORMAL HIGH (ref 0.70–1.18)
Globulin: 2.3 g/dL (calc) (ref 1.9–3.7)
Glucose, Bld: 134 mg/dL — ABNORMAL HIGH (ref 65–99)
Potassium: 4.2 mmol/L (ref 3.5–5.3)
Sodium: 140 mmol/L (ref 135–146)
Total Bilirubin: 0.5 mg/dL (ref 0.2–1.2)
Total Protein: 6.6 g/dL (ref 6.1–8.1)

## 2019-08-26 ENCOUNTER — Other Ambulatory Visit: Payer: Self-pay | Admitting: Family Medicine

## 2019-08-26 MED ORDER — WARFARIN SODIUM 5 MG PO TABS: 10.0000 mg | ORAL_TABLET | Freq: Every day | ORAL | 3 refills | Status: DC

## 2019-08-27 ENCOUNTER — Other Ambulatory Visit: Payer: Self-pay | Admitting: Family Medicine

## 2019-08-28 ENCOUNTER — Other Ambulatory Visit: Payer: Self-pay | Admitting: Family Medicine

## 2019-08-28 DIAGNOSIS — I4891 Unspecified atrial fibrillation: Secondary | ICD-10-CM

## 2019-09-05 ENCOUNTER — Other Ambulatory Visit: Payer: Self-pay | Admitting: Family Medicine

## 2019-09-05 MED ORDER — PIOGLITAZONE HCL 15 MG PO TABS: 15.0000 mg | ORAL_TABLET | Freq: Every day | ORAL | 1 refills | Status: DC

## 2019-09-12 ENCOUNTER — Other Ambulatory Visit: Payer: Self-pay | Admitting: Family Medicine

## 2019-09-12 DIAGNOSIS — G894 Chronic pain syndrome: Secondary | ICD-10-CM

## 2019-09-12 MED ORDER — HYDROCODONE-ACETAMINOPHEN 5-325 MG PO TABS: 1.0000 | ORAL_TABLET | Freq: Four times a day (QID) | ORAL | 0 refills | Status: DC | PRN

## 2019-09-12 NOTE — Telephone Encounter (Signed)
Patient is requesting a refill on Hydrocodone   LOV: 09/23/2018  LRF:   08/07/2019

## 2019-09-18 ENCOUNTER — Other Ambulatory Visit: Payer: Self-pay

## 2019-09-18 ENCOUNTER — Encounter: Payer: Self-pay | Admitting: Family Medicine

## 2019-09-18 ENCOUNTER — Ambulatory Visit (INDEPENDENT_AMBULATORY_CARE_PROVIDER_SITE_OTHER): Payer: Medicare HMO | Admitting: Family Medicine

## 2019-09-18 VITALS — BP 126/74 | HR 54 | Temp 97.0°F | Resp 14 | Ht 68.0 in | Wt 156.0 lb

## 2019-09-18 DIAGNOSIS — Z794 Long term (current) use of insulin: Secondary | ICD-10-CM

## 2019-09-18 DIAGNOSIS — I10 Essential (primary) hypertension: Secondary | ICD-10-CM | POA: Diagnosis not present

## 2019-09-18 DIAGNOSIS — E119 Type 2 diabetes mellitus without complications: Secondary | ICD-10-CM

## 2019-09-18 DIAGNOSIS — I4891 Unspecified atrial fibrillation: Secondary | ICD-10-CM | POA: Diagnosis not present

## 2019-09-18 LAB — PT WITH INR/FINGERSTICK
INR, fingerstick: 2 ratio — ABNORMAL HIGH
PT, fingerstick: 24.6 s — ABNORMAL HIGH (ref 10.5–13.1)
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
## 2019-09-30 ENCOUNTER — Other Ambulatory Visit: Payer: Self-pay | Admitting: Family Medicine

## 2019-09-30 MED ORDER — ACCU-CHEK AVIVA PLUS VI STRP: ORAL_STRIP | 3 refills | Status: DC

## 2019-10-06 ENCOUNTER — Telehealth: Payer: Self-pay | Admitting: Family Medicine

## 2019-10-06 NOTE — Chronic Care Management (AMB) (Signed)
  Chronic Care Management   Note  10/06/2019 Name: Zachary Reed. MRN: [id number] DOB: [birth year]  Zachary Halt. is a 74 y.o. year old male who is a primary care patient of Susy Frizzle, MD. I reached out to [employer]. by phone today in response to a referral sent by Mr. Zachary Hoel Jr.'s PCP, Dennard Schaumann Cammie Mcgee, MD.   Zachary Reed was given information about Chronic Care Management services today including:  1. CCM service includes personalized support from designated clinical staff supervised by his physician, including individualized plan of care and coordination with other care providers 2. 24/7 contact phone numbers for assistance for urgent and routine care needs. 3. Service will only be billed when office clinical staff spend 20 minutes or more in a month to coordinate care. 4. Only one practitioner may furnish and bill the service in a calendar month. 5. The patient may stop CCM services at any time (effective at the end of the month) by phone call to the office staff.   Patient agreed to services and verbal consent obtained.   Follow up plan:   [place]

## 2019-10-09 ENCOUNTER — Other Ambulatory Visit: Payer: Self-pay | Admitting: Family Medicine

## 2019-10-09 DIAGNOSIS — G894 Chronic pain syndrome: Secondary | ICD-10-CM

## 2019-10-09 NOTE — Telephone Encounter (Signed)
Patient is requesting a refill on Hydrocodone   LOV: 10/06/2019  LRF:   09/12/2019

## 2019-10-10 MED ORDER — HYDROCODONE-ACETAMINOPHEN 5-325 MG PO TABS: 1.0000 | ORAL_TABLET | Freq: Four times a day (QID) | ORAL | 0 refills | Status: DC | PRN

## 2019-10-31 ENCOUNTER — Encounter: Payer: Self-pay | Admitting: Family Medicine

## 2019-10-31 ENCOUNTER — Other Ambulatory Visit: Payer: Self-pay

## 2019-10-31 ENCOUNTER — Ambulatory Visit (INDEPENDENT_AMBULATORY_CARE_PROVIDER_SITE_OTHER): Payer: Medicare HMO | Admitting: Family Medicine

## 2019-10-31 VITALS — BP 110/72 | HR 68 | Temp 97.3°F | Ht 68.0 in | Wt 156.0 lb

## 2019-10-31 DIAGNOSIS — G894 Chronic pain syndrome: Secondary | ICD-10-CM | POA: Diagnosis not present

## 2019-10-31 DIAGNOSIS — I4891 Unspecified atrial fibrillation: Secondary | ICD-10-CM

## 2019-10-31 LAB — PT WITH INR/FINGERSTICK
INR, fingerstick: 3 ratio — ABNORMAL HIGH
PT, fingerstick: 36.5 s — ABNORMAL HIGH (ref 10.5–13.1)

## 2019-10-31 MED ORDER — HYDROCODONE-ACETAMINOPHEN 5-325 MG PO TABS: 1.0000 | ORAL_TABLET | Freq: Four times a day (QID) | ORAL | 0 refills | Status: DC | PRN

## 2019-10-31 MED ORDER — SILDENAFIL CITRATE 100 MG PO TABS: 50.0000 mg | ORAL_TABLET | Freq: Every day | ORAL | 2 refills | Status: DC | PRN
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
## 2019-11-06 ENCOUNTER — Other Ambulatory Visit: Payer: Self-pay

## 2019-11-06 DIAGNOSIS — G894 Chronic pain syndrome: Secondary | ICD-10-CM

## 2019-11-06 NOTE — Telephone Encounter (Signed)
Quality too much needs to be sent to local pharmacy

## 2019-11-07 MED ORDER — HYDROCODONE-ACETAMINOPHEN 5-325 MG PO TABS: 1.0000 | ORAL_TABLET | Freq: Four times a day (QID) | ORAL | 0 refills | Status: DC | PRN

## 2019-11-07 NOTE — Telephone Encounter (Signed)
Refilled to a smaller quantity, by K. Buelah Manis, MD

## 2019-11-11 ENCOUNTER — Other Ambulatory Visit: Payer: Self-pay | Admitting: Family Medicine

## 2019-11-11 DIAGNOSIS — I480 Paroxysmal atrial fibrillation: Secondary | ICD-10-CM

## 2019-11-11 DIAGNOSIS — Z794 Long term (current) use of insulin: Secondary | ICD-10-CM

## 2019-11-11 DIAGNOSIS — E785 Hyperlipidemia, unspecified: Secondary | ICD-10-CM

## 2019-11-12 NOTE — Chronic Care Management (AMB) (Addendum)
Chronic Care Management Pharmacy  Name: Zachary Reed.  MRN: [id number] DOB: December 03, 1945  Chief Complaint/ HPI  Zachary Reed.,  74 y.o. , male presents for their Initial CCM visit with the clinical pharmacist via telephone.  PCP : Susy Frizzle, MD  Their chronic conditions include: Hypertension, Afib, Type II DM, hyperlipidemia, thyroid disease.  Office Visits:  10/31/2019 ([place]) - INR check was therapeutic, switched him to 90 day Rx's on Hydrocodone through [facility] due to cost  09/18/2019 ([place]) - INR therapeutic, 2 hour post-prandial sugars controlled 140-160  08/05/2019  ([place]) - INR 1.7, blood pressure elevated, highly variable blood sugar between 40 and 300.  Struggles with dietary choices while at work.  Increased lisinopril to '20mg'$  daily with recheck in one month.  06/24/2019 ([place]) - not a good candidate for rapid acting insulin due to variable diet, patient is having symptomatic hypoglycemia, wants patient to restart pioglitazone '15mg'$  and titrate up to '30mg'$  daily if tolerated  Consult Visit: no consult visits recently  Medications: Outpatient Encounter Medications as of 11/14/2019  Medication Sig   Accu-Chek FastClix Lancets MISC USE AS DIRECTED   Ascorbic Acid (VITAMIN C) 1000 MG tablet Take 1,000 mg by mouth daily.   b complex vitamins tablet Take 1 tablet by mouth daily.   Blood Glucose Calibration (ACCU-CHEK AVIVA) SOLN Use as directed to calibrate glucometer   Blood Glucose Monitoring Suppl (ACCU-CHEK AVIVA PLUS) w/Device KIT CHECK BLOOD SUGAR FOUR TIMES DAILY DX: E11.9   Cholecalciferol (VITAMIN D3) 250 MCG (10000 UT) capsule Take 10,000 Units by mouth daily.   diltiazem (CARDIZEM CD) 120 MG 24 hr capsule TAKE 1 CAPSULE (120 MG TOTAL) BY MOUTH DAILY.   glucagon (GLUCAGON EMERGENCY) 1 MG injection Inject 1 mg into the vein once as needed for up to 1 dose.   glucose blood (ACCU-CHEK AVIVA PLUS) test strip TEST BLOOD SUGAR FOUR TIMES DAILY (BEFORE MEALS  AND AT BEDTIME)   glucose blood test strip 1 each by Other route as needed for other. Use as instructed   HYDROcodone-acetaminophen (NORCO/VICODIN) 5-325 MG tablet Take 1 tablet by mouth every 6 (six) hours as needed for moderate pain.   insulin aspart (NOVOLOG) 100 UNIT/ML injection INJECT 4 UNITS SUBCUTANEOUSLY THREE TIMES DAILY WITH EACH MEAL  (DISCARD VIAL 28 DAYS AFTER OPENING) (Patient taking differently: 5 Units. INJECT 4 UNITS SUBCUTANEOUSLY THREE TIMES DAILY WITH EACH MEAL  (DISCARD VIAL 28 DAYS AFTER OPENING))   insulin glargine (LANTUS) 100 UNIT/ML injection Inject 0.12 mLs (12 Units total) into the skin at bedtime. (Patient taking differently: Inject 15 Units into the skin at bedtime. )   Lancets Misc. (ACCU-CHEK FASTCLIX LANCET) KIT Use to check blood sugar   levothyroxine (SYNTHROID) 100 MCG tablet TAKE 1 TABLET EVERY DAY   lisinopril (ZESTRIL) 20 MG tablet Take 1 tablet (20 mg total) by mouth daily.   metFORMIN (GLUCOPHAGE) 500 MG tablet TAKE 1 TABLET TWICE DAILY WITH MEALS   pioglitazone (ACTOS) 15 MG tablet Take 1 tablet (15 mg total) by mouth daily.   sildenafil (REVATIO) 20 MG tablet TAKE 1 TABLET BY MOUTH 30 MINUTES BEFORE SEXUAL INTERCOURSE AS DIRECTED   sildenafil (VIAGRA) 100 MG tablet Take 0.5-1 tablets (50-100 mg total) by mouth daily as needed for erectile dysfunction.   silver sulfADIAZINE (SILVADENE) 1 % cream Apply 1 application topically daily.   simvastatin (ZOCOR) 10 MG tablet TAKE 1 TABLET EVERY DAY  AT  6:00PM   warfarin (COUMADIN) 5 MG tablet Take 2 tablets (  10 mg total) by mouth daily.   zinc gluconate 50 MG tablet Take 50 mg by mouth daily.   No facility-administered encounter medications on file as of 11/14/2019.     Current Diagnosis/Assessment:    Merchant navy officer: Low Risk    Difficulty of Paying Living Expenses: Not very hard    Goals Addressed             This Visit's Progress    Pharmacy Care Plan:       CARE PLAN  ENTRY  Current Barriers:  Chronic Disease Management support, education, and care coordination needs related to Hypertension, Hyperlipidemia, Diabetes, and Atrial Fibrillation   Hypertension BP Readings from Last 3 Encounters:  10/31/19 110/72  09/18/19 126/74  08/05/19 (!) 160/82   Pharmacist Clinical Goal(s): Over the next 90 days, patient will work with PharmD and providers to maintain BP goal <130/80 Current regimen:  Diltiazem '120mg'$  Lisinopril '20mg'$  Interventions: Reviewed home blood pressure readings Comprehensive medication review Patient self care activities - Over the next 90 days, patient will: Check BP when symptomatic, document, and provide at future appointments Ensure daily salt intake < 2300 mg/day  Hyperlipidemia Lab Results  Component Value Date/Time   [facility] 76 08/14/2019 02:34 PM   Pharmacist Clinical Goal(s): Over the next 90 days, patient will work with PharmD and providers to achieve LDL goal < 70 Current regimen:  Simvastatin '10mg'$  Interventions: Discussed dietary recommendations for lowering cholesterol Reviewed intensity of current statin medication, recommend increase to simvastatin '20mg'$  if lipids not WNL after diet modification at next PCP visit Patient self care activities - Over the next 90 days, patient will: Work on cutting chips and other carbohydrate snack foods back, continue to watch servings of sweets and sugary foods Present for lab work at next visit with Dr. Dennard Schaumann  Diabetes Lab Results  Component Value Date/Time   HGBA1C 8.1 (H) 08/14/2019 02:34 PM   HGBA1C 8.6 (H) 05/13/2019 02:39 PM   HGBA1C 7.8 (H) 11/23/2016 03:52 PM   HGBA1C 7.3 (H) 02/24/2016 03:54 PM   Pharmacist Clinical Goal(s): Over the next 90 days, patient will work with PharmD and providers to achieve A1c goal <7% Current regimen:  Metformin '500mg'$  twice daily Pioglitazone '15mg'$  daily Novolog 100u/ml 4 units three times daily Lantus 100u/mL 12 units at  bedtime Interventions: Reviewed home blood sugar logs Counseled on diet modifications to regulate blood sugar Developed dietary plan for moving forward Patient self care activities - Over the next 90 days, patient will: Check blood sugar 3-4 times daily, document, and provide at future appointments Contact provider with any episodes of hypoglycemia Work on cutting back on foods knows to have high carbohydrate content  Afib Pharmacist Clinical Goal(s) Over the next 90 days, patient will work with PharmD and providers to optimize medication related to Afib. Current regimen:  Diltiazem '120mg'$  daily Warfarin '5mg'$  Interventions: Comprehensive medication review Reviewed last few INRs Patient self care activities - Over the next 90 days, patient will: Focus on medication adherence by pill count. Contact PharmD or PCP with any abnormal bruising or bleeding.  Initial goal documentation         AFIB   Patient is currently rate controlled.   Patient has failed these meds in past: none noted Patient is currently controlled on the following medications: diltiazem CD, '120mg'$ , warfarin '5mg'$  daily  No chest pain, no abnormal bleeding.  INR therapeutic last few visits.  Plan  Continue current medications   Diabetes   Recent Relevant Labs:  Lab Results  Component Value Date/Time   HGBA1C 8.1 (H) 08/14/2019 02:34 PM   HGBA1C 8.6 (H) 05/13/2019 02:39 PM   HGBA1C 7.8 (H) 11/23/2016 03:52 PM   HGBA1C 7.3 (H) 02/24/2016 03:54 PM   MICROALBUR CANCELED 06/13/2018 03:29 PM   MICROALBUR <0.2 06/13/2018 03:29 PM     Checking BG: Before meals  Recent FBG Readings: 110s  Recent HS BG readings: 180s Patient has failed these meds in past: none noted Patient is currently controlled on the following medications: metformin '500mg'$  twice daily, pioglitazone '15mg'$ , Novolog 100u/mL 4 units tid with a meal, Lantus 100u/mL 12 units hs  Last diabetic Foot exam:  Lab Results  Component Value  Date/Time   HMDIABEYEEXA No Retinopathy 08/27/2014 12:00 AM    Last diabetic Eye exam: No results found for: HMDIABFOOTEX   Patient has recently cut out candy.  Other snacks include: chips, crackers.  Today he ate a Hungry Man frozen dinner around 3-4pm.  We discussed diet extensively.  Patient verbalized he is supposed to watch out for "white carbs." He has one bag of chips open and he will work on cutting back on those after he is finished.  No low blood sugars reported.  Most home logs WNL per patient.  Due for follow up in July with Dr. Dennard Schaumann.  Plan  Continue current medications and continue to adjust diet cutting out snacks high in carbohydrates and sugars.  Hypertension   Office blood pressures are  BP Readings from Last 3 Encounters:  10/31/19 110/72  09/18/19 126/74  08/05/19 (!) 160/82    Patient has failed these meds in the past: none noted  Patient checks BP at home infrequently  Patient home BP readings are ranging: 110/70s  Patient is currently controlled on the following medications: diltiazem '120mg'$ , lisinopril '20mg'$ ,   Checks his BP whenever his wife checks hers and he is around. Always controlled at home when he checks.  Takes medication appropriately.  Office BP's normal.  Plan  Continue current medications     Hyperlipidemia   LDL goal < 70  Lipid Panel     Component Value Date/Time   CHOL 143 08/14/2019 1434   TRIG 63 08/14/2019 1434   HDL 53 08/14/2019 1434   LDLCALC 76 08/14/2019 1434    Hepatic Function Latest Ref Rng & Units 08/14/2019 05/13/2019 11/05/2018  Total Protein 6.1 - 8.1 g/dL 6.6 7.0 6.8  Albumin 3.6 - 5.1 g/dL - - -  AST 10 - 35 U/L '17 21 17  '$ ALT 9 - 46 U/L '13 17 10  '$ Alk Phosphatase 40 - 115 U/L - - -  Total Bilirubin 0.2 - 1.2 mg/dL 0.5 0.7 0.5     The 10-year ASCVD risk score Mikey Bussing DC Jr., et al., 2013) is: 32.5%   Values used to calculate the score:     Age: 61 years     Sex: Male     Is Non-Hispanic African American: No      Diabetic: Yes     Tobacco smoker: No     Systolic Blood Pressure: 409 mmHg     Is BP treated: Yes     HDL Cholesterol: 53 mg/dL     Total Cholesterol: 143 mg/dL   Patient has failed these meds in past: none noted Patient is currently uncontrolled on the following medications:  Simvastatin '10mg'$   Tolerates well, would like to see LDL below 70. On low intensity statin.  Patient wants to attempt to fix with diet first.  Has one month until next visit with Dr. Dennard Schaumann.  See ASCVD risk above.  Plan  Continue current medications.  Work on cutting back on chips and other snack foods.  Recommend lipid panel in July.  If LDL > 70 recommend increase to simvastatin '20mg'$  daily, moderate intensity statin in diabetes.  Hypothyroidism   Lab Results  Component Value Date/Time   TSH 1.29 05/13/2019 02:39 PM   TSH 0.77 06/13/2018 03:29 PM   TSH 1.07 12/06/2017 03:56 PM    Patient has failed these meds in past: none noted Patient is currently controlled on the following medications:  Levothyroxine 100 mcg  Adherent to medication, takes appropriately  Plan  Continue current medications  Vaccines   Reviewed and discussed patient's vaccination history.    Immunization History  Administered Date(s) Administered   Influenza Split 03/06/2009, 03/15/2011   Influenza-Unspecified 02/15/2017   Pneumococcal Polysaccharide-23 12/19/2000, 08/11/2011   Td 08/11/2011    Plan  Recommended patient receive [id number], COVID-19 vaccine in office/pharmacy.   Medication Management   Miscellaneous medications: sildenafil '100mg'$  OTC's: zinc '50mg'$ , Vitamin D 265mg, Vitamin C 1000 [facility]  Phone #  [phone] reports using no specific method to organize medications and promote adherence. Patient denies missed doses of medication.   CBeverly Milch PharmD Clinical Pharmacist [phone] have collaborated with the  care management provider regarding care management and care coordination activities outlined in this encounter and have reviewed this encounter including documentation in the note and care plan. I am certifying that I agree with the content of this note and encounter as supervising physician.

## 2019-11-14 ENCOUNTER — Ambulatory Visit: Payer: Medicare HMO | Admitting: Pharmacist

## 2019-11-14 ENCOUNTER — Other Ambulatory Visit: Payer: Self-pay

## 2019-11-14 DIAGNOSIS — I4891 Unspecified atrial fibrillation: Secondary | ICD-10-CM

## 2019-11-14 DIAGNOSIS — I10 Essential (primary) hypertension: Secondary | ICD-10-CM

## 2019-11-14 DIAGNOSIS — E785 Hyperlipidemia, unspecified: Secondary | ICD-10-CM

## 2019-11-14 DIAGNOSIS — Z794 Long term (current) use of insulin: Secondary | ICD-10-CM

## 2019-11-14 NOTE — Patient Instructions (Addendum)
Visit Information Thank you for meeting with me today!  I look forward to working with you to help you meet all of your healthcare goals and answer any questions you may have.  Feel free to contact me anytime!  Goals Addressed            This Visit's Progress   . Pharmacy Care Plan:       CARE PLAN ENTRY  Current Barriers:  . Chronic Disease Management support, education, and care coordination needs related to Hypertension, Hyperlipidemia, Diabetes, and Atrial Fibrillation   Hypertension BP Readings from Last 3 Encounters:  10/31/19 110/72  09/18/19 126/74  08/05/19 (!) 160/82   . Pharmacist Clinical Goal(s): o Over the next 90 days, patient will work with PharmD and providers to maintain BP goal <130/80 . Current regimen:  o Diltiazem 120mg  o Lisinopril 20mg  . Interventions: o Reviewed home blood pressure readings o Comprehensive medication review . Patient self care activities - Over the next 90 days, patient will: o Check BP when symptomatic, document, and provide at future appointments o Ensure daily salt intake < 2300 mg/day  Hyperlipidemia Lab Results  Component Value Date/Time   LDLCALC 76 08/14/2019 02:34 PM   . Pharmacist Clinical Goal(s): o Over the next 90 days, patient will work with PharmD and providers to achieve LDL goal < 70 . Current regimen:  o Simvastatin 10mg  . Interventions: o Discussed dietary recommendations for lowering cholesterol o Reviewed intensity of current statin medication, recommend increase to simvastatin 20mg  if lipids not WNL after diet modification at next PCP visit . Patient self care activities - Over the next 90 days, patient will: o Work on cutting chips and other carbohydrate snack foods back, continue to watch servings of sweets and sugary foods o Present for lab work at next visit with Dr. Dennard Schaumann  Diabetes Lab Results  Component Value Date/Time   HGBA1C 8.1 (H) 08/14/2019 02:34 PM   HGBA1C 8.6 (H) 05/13/2019 02:39 PM    HGBA1C 7.8 (H) 11/23/2016 03:52 PM   HGBA1C 7.3 (H) 02/24/2016 03:54 PM   . Pharmacist Clinical Goal(s): o Over the next 90 days, patient will work with PharmD and providers to achieve A1c goal <7% . Current regimen:  o Metformin 500mg  twice daily o Pioglitazone 15mg  daily o Novolog 100u/ml 4 units three times daily o Lantus 100u/mL 12 units at bedtime . Interventions: o Reviewed home blood sugar logs o Counseled on diet modifications to regulate blood sugar o Developed dietary plan for moving forward . Patient self care activities - Over the next 90 days, patient will: o Check blood sugar 3-4 times daily, document, and provide at future appointments o Contact provider with any episodes of hypoglycemia o Work on cutting back on foods knows to have high carbohydrate content  Afib . Pharmacist Clinical Goal(s) o Over the next 90 days, patient will work with PharmD and providers to optimize medication related to Afib. . Current regimen:  o Diltiazem 120mg  daily o Warfarin 5mg  . Interventions: o Comprehensive medication review o Reviewed last few INRs . Patient self care activities - Over the next 90 days, patient will: o Focus on medication adherence by pill count. o Contact PharmD or PCP with any abnormal bruising or bleeding.  Initial goal documentation        Zachary Reed was given information about Chronic Care Management services today including:  1. CCM service includes personalized support from designated clinical staff supervised by his physician, including individualized plan of care  and coordination with other care providers 2. 24/7 contact phone numbers for assistance for urgent and routine care needs. 3. Standard insurance, coinsurance, copays and deductibles apply for chronic care management only during months in which we provide at least 20 minutes of these services. Most insurances cover these services at 100%, however patients may be responsible for any copay,  coinsurance and/or deductible if applicable. This service may help you avoid the need for more expensive face-to-face services. 4. Only one practitioner may furnish and bill the service in a calendar month. 5. The patient may stop CCM services at any time (effective at the end of the month) by phone call to the office staff.  Patient agreed to services and verbal consent obtained.   The patient verbalized understanding of instructions provided today and agreed to receive a mailed copy of patient instruction and/or educational materials. Telephone follow up appointment with pharmacy team member scheduled for: 02/18/2020 @ 12:30 PM  Beverly Milch, PharmD Clinical Pharmacist [place] [phone]    Carbohydrate Counting for Diabetes Mellitus, Adult  Carbohydrate counting is a method of keeping track of how many carbohydrates you eat. Eating carbohydrates naturally increases the amount of sugar (glucose) in the blood. Counting how many carbohydrates you eat helps keep your blood glucose within normal limits, which helps you manage your diabetes (diabetes mellitus). It is important to know how many carbohydrates you can safely have in each meal. This is different for every person. A diet and nutrition specialist (registered dietitian) can help you make a meal plan and calculate how many carbohydrates you should have at each meal and snack. Carbohydrates are found in the following foods:  Grains, such as breads and cereals.  Dried beans and soy products.  Starchy vegetables, such as potatoes, peas, and corn.  Fruit and fruit juices.  Milk and yogurt.  Sweets and snack foods, such as cake, cookies, candy, chips, and soft drinks. How do I count carbohydrates? There are two ways to count carbohydrates in food. You can use either of the methods or a combination of both. Reading "Nutrition Facts" on packaged food The "Nutrition Facts" list is included on the labels of  almost all packaged foods and beverages in the [place] It includes:  The serving size.  Information about nutrients in each serving, including the grams (g) of carbohydrate per serving. To use the "Nutrition Facts":  Decide how many servings you will have.  Multiply the number of servings by the number of carbohydrates per serving.  The resulting number is the total amount of carbohydrates that you will be having. Learning standard serving sizes of other foods When you eat carbohydrate foods that are not packaged or do not include "Nutrition Facts" on the label, you need to measure the servings in order to count the amount of carbohydrates:  Measure the foods that you will eat with a food scale or measuring cup, if needed.  Decide how many standard-size servings you will eat.  Multiply the number of servings by 15. Most carbohydrate-rich foods have about 15 g of carbohydrates per serving. ? For example, if you eat 8 oz (170 g) of strawberries, you will have eaten 2 servings and 30 g of carbohydrates (2 servings x 15 g = 30 g).  For foods that have more than one food mixed, such as soups and casseroles, you must count the carbohydrates in each food that is included. The following list contains standard serving sizes of common carbohydrate-rich foods. Each  of these servings has about 15 g of carbohydrates:   hamburger bun or  English muffin.   oz (15 mL) syrup.   oz (14 g) jelly.  1 slice of bread.  1 six-inch tortilla.  3 oz (85 g) cooked rice or pasta.  4 oz (113 g) cooked dried beans.  4 oz (113 g) starchy vegetable, such as peas, corn, or potatoes.  4 oz (113 g) hot cereal.  4 oz (113 g) mashed potatoes or  of a large baked potato.  4 oz (113 g) canned or frozen fruit.  4 oz (120 mL) fruit juice.  4-6 crackers.  6 chicken nuggets.  6 oz (170 g) unsweetened dry cereal.  6 oz (170 g) plain fat-free yogurt or yogurt sweetened with artificial sweeteners.  8 oz  (240 mL) milk.  8 oz (170 g) fresh fruit or one small piece of fruit.  24 oz (680 g) popped popcorn. Example of carbohydrate counting Sample meal  3 oz (85 g) chicken breast.  6 oz (170 g) brown rice.  4 oz (113 g) corn.  8 oz (240 mL) milk.  8 oz (170 g) strawberries with sugar-free whipped topping. Carbohydrate calculation 1. Identify the foods that contain carbohydrates: ? Rice. ? Corn. ? Milk. ? Strawberries. 2. Calculate how many servings you have of each food: ? 2 servings rice. ? 1 serving corn. ? 1 serving milk. ? 1 serving strawberries. 3. Multiply each number of servings by 15 g: ? 2 servings rice x 15 g = 30 g. ? 1 serving corn x 15 g = 15 g. ? 1 serving milk x 15 g = 15 g. ? 1 serving strawberries x 15 g = 15 g. 4. Add together all of the amounts to find the total grams of carbohydrates eaten: ? 30 g + 15 g + 15 g + 15 g = 75 g of carbohydrates total. Summary  Carbohydrate counting is a method of keeping track of how many carbohydrates you eat.  Eating carbohydrates naturally increases the amount of sugar (glucose) in the blood.  Counting how many carbohydrates you eat helps keep your blood glucose within normal limits, which helps you manage your diabetes.  A diet and nutrition specialist (registered dietitian) can help you make a meal plan and calculate how many carbohydrates you should have at each meal and snack. This information is not intended to replace advice given to you by your health care provider. Make sure you discuss any questions you have with your health care provider. Document Revised: 12/07/2016 Document Reviewed: 10/27/2015 Elsevier Patient Education  [place].

## 2019-11-17 ENCOUNTER — Other Ambulatory Visit: Payer: Self-pay

## 2019-11-17 MED ORDER — PIOGLITAZONE HCL 15 MG PO TABS: 15.0000 mg | ORAL_TABLET | Freq: Every day | ORAL | 1 refills | Status: DC

## 2019-11-17 MED ORDER — SILDENAFIL CITRATE 20 MG PO TABS: ORAL_TABLET | ORAL | 5 refills | Status: DC

## 2019-12-12 ENCOUNTER — Ambulatory Visit (INDEPENDENT_AMBULATORY_CARE_PROVIDER_SITE_OTHER): Payer: Medicare HMO | Admitting: Family Medicine

## 2019-12-12 ENCOUNTER — Other Ambulatory Visit: Payer: Self-pay

## 2019-12-12 VITALS — BP 110/80 | HR 41 | Temp 98.3°F | Ht 68.0 in | Wt 159.0 lb

## 2019-12-12 DIAGNOSIS — I4891 Unspecified atrial fibrillation: Secondary | ICD-10-CM

## 2019-12-12 LAB — PT WITH INR/FINGERSTICK
INR, fingerstick: 3 ratio — ABNORMAL HIGH
PT, fingerstick: 36.2 s — ABNORMAL HIGH (ref 10.5–13.1)
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
## 2019-12-18 ENCOUNTER — Other Ambulatory Visit: Payer: Self-pay | Admitting: Family Medicine

## 2019-12-18 DIAGNOSIS — G894 Chronic pain syndrome: Secondary | ICD-10-CM

## 2019-12-18 NOTE — Telephone Encounter (Signed)
Patient requesting a refill on his hydrocodone.  CB# [phone]

## 2019-12-19 MED ORDER — HYDROCODONE-ACETAMINOPHEN 5-325 MG PO TABS: 1.0000 | ORAL_TABLET | Freq: Four times a day (QID) | ORAL | 0 refills | Status: DC | PRN

## 2019-12-19 NOTE — Telephone Encounter (Signed)
Ok to refill??  Last office visit 12/12/2019.  Last refill 11/07/2019.

## 2020-01-15 ENCOUNTER — Ambulatory Visit: Payer: Medicare HMO | Admitting: Podiatry

## 2020-01-15 ENCOUNTER — Encounter: Payer: Self-pay | Admitting: Podiatry

## 2020-01-15 ENCOUNTER — Other Ambulatory Visit: Payer: Self-pay

## 2020-01-15 DIAGNOSIS — M79674 Pain in right toe(s): Secondary | ICD-10-CM

## 2020-01-15 DIAGNOSIS — Z794 Long term (current) use of insulin: Secondary | ICD-10-CM

## 2020-01-15 DIAGNOSIS — D689 Coagulation defect, unspecified: Secondary | ICD-10-CM | POA: Diagnosis not present

## 2020-01-15 DIAGNOSIS — T148XXA Other injury of unspecified body region, initial encounter: Secondary | ICD-10-CM

## 2020-01-15 DIAGNOSIS — M79675 Pain in left toe(s): Secondary | ICD-10-CM

## 2020-01-15 DIAGNOSIS — B351 Tinea unguium: Secondary | ICD-10-CM | POA: Diagnosis not present

## 2020-01-15 DIAGNOSIS — L089 Local infection of the skin and subcutaneous tissue, unspecified: Secondary | ICD-10-CM | POA: Insufficient documentation

## 2020-01-15 DIAGNOSIS — E0840 Diabetes mellitus due to underlying condition with diabetic neuropathy, unspecified: Secondary | ICD-10-CM

## 2020-01-15 MED ORDER — DOXYCYCLINE HYCLATE 100 MG PO TABS
100.0000 mg | ORAL_TABLET | Freq: Two times a day (BID) | ORAL | 0 refills | Status: AC
Start: 2020-01-15 — End: 2020-01-22

## 2020-01-16 ENCOUNTER — Encounter: Payer: Self-pay | Admitting: Podiatry

## 2020-01-16 NOTE — Progress Notes (Signed)
This patient presents to the office with chief complaint of red and swollen second toe right foot.  Patient says he cut himself on the lawnmower days ago.  He says the toe became very red and swollen but it has improved since that time time.  He says he has self treated himself with peroxide washes.  He says the toe is improving since he called to make an appointment.  He is a diabetic with long thick big toenails.  These nails are painful walking and wearing his shoes.  He presents to the office for evaluation and treatment.  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  General Appearance  Alert, conversant and in no acute stress.  Vascular  Dorsalis pedis and posterior tibial  pulses are palpable  bilaterally.  Capillary return is within normal limits  bilaterally. Temperature is within normal limits  bilaterally.  Neurologic  Senn-Weinstein monofilament wire test within normal limits  bilaterally. Muscle power within normal limits bilaterally.  Nails Thick disfigured discolored nails with subungual debris  Hallux nails  bilaterally. No evidence of bacterial infection or drainage bilaterally.  Orthopedic  No limitations of motion  feet .  No crepitus or effusions noted.  No bony pathology or digital deformities noted.  Skin  normotropic skin with no porokeratosis noted bilaterally.  No signs of infections or ulcers noted.  Faint red inflamed second toe right .  He has laceration noted on the dorsum of second toe right above the middle phalanx dorsally.  Infected Laceration second toe right foot.  Onychomycosis hallux nails  B/l.  ROV.  Debride hallux nails with nail nipper and dremel tool usage.  Doxycycline # 14 prescribed.  RTC 4 months for nail care.  Told to call the office if the second toe worsens.   Gardiner Barefoot DPM

## 2020-01-23 ENCOUNTER — Ambulatory Visit (INDEPENDENT_AMBULATORY_CARE_PROVIDER_SITE_OTHER): Payer: Medicare HMO | Admitting: Family Medicine

## 2020-01-23 ENCOUNTER — Other Ambulatory Visit: Payer: Self-pay

## 2020-01-23 VITALS — BP 130/90 | HR 75 | Temp 97.0°F | Ht 68.0 in | Wt 158.0 lb

## 2020-01-23 DIAGNOSIS — I4891 Unspecified atrial fibrillation: Secondary | ICD-10-CM

## 2020-01-23 DIAGNOSIS — G894 Chronic pain syndrome: Secondary | ICD-10-CM

## 2020-01-23 LAB — PT WITH INR/FINGERSTICK
INR, fingerstick: 2.5 ratio — ABNORMAL HIGH
PT, fingerstick: 29.9 s — ABNORMAL HIGH (ref 10.5–13.1)

## 2020-01-23 MED ORDER — HYDROCODONE-ACETAMINOPHEN 5-325 MG PO TABS: 1.0000 | ORAL_TABLET | Freq: Four times a day (QID) | ORAL | 0 refills | Status: DC | PRN
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
## 2020-01-27 ENCOUNTER — Other Ambulatory Visit: Payer: Self-pay | Admitting: Family Medicine

## 2020-01-27 DIAGNOSIS — I4891 Unspecified atrial fibrillation: Secondary | ICD-10-CM

## 2020-01-27 MED ORDER — DILTIAZEM HCL ER COATED BEADS 120 MG PO CP24: 120.0000 mg | ORAL_CAPSULE | Freq: Every day | ORAL | 3 refills | Status: DC

## 2020-02-13 ENCOUNTER — Other Ambulatory Visit: Payer: Self-pay | Admitting: Family Medicine

## 2020-02-18 ENCOUNTER — Telehealth: Payer: Medicare HMO

## 2020-02-23 ENCOUNTER — Other Ambulatory Visit: Payer: Self-pay

## 2020-02-23 DIAGNOSIS — G894 Chronic pain syndrome: Secondary | ICD-10-CM

## 2020-02-23 MED ORDER — HYDROCODONE-ACETAMINOPHEN 5-325 MG PO TABS: 1.0000 | ORAL_TABLET | Freq: Four times a day (QID) | ORAL | 0 refills | Status: DC | PRN

## 2020-03-05 ENCOUNTER — Other Ambulatory Visit: Payer: Self-pay

## 2020-03-05 ENCOUNTER — Ambulatory Visit (INDEPENDENT_AMBULATORY_CARE_PROVIDER_SITE_OTHER): Payer: Medicare HMO | Admitting: Family Medicine

## 2020-03-05 VITALS — BP 150/80 | HR 73 | Temp 97.8°F | Ht 68.0 in | Wt 159.0 lb

## 2020-03-05 DIAGNOSIS — M25511 Pain in right shoulder: Secondary | ICD-10-CM | POA: Diagnosis not present

## 2020-03-05 DIAGNOSIS — G8929 Other chronic pain: Secondary | ICD-10-CM

## 2020-03-05 DIAGNOSIS — I480 Paroxysmal atrial fibrillation: Secondary | ICD-10-CM | POA: Diagnosis not present

## 2020-03-05 DIAGNOSIS — I4891 Unspecified atrial fibrillation: Secondary | ICD-10-CM

## 2020-03-05 LAB — PT WITH INR/FINGERSTICK
INR, fingerstick: 3 ratio — ABNORMAL HIGH
PT, fingerstick: 35.7 s — ABNORMAL HIGH (ref 10.5–13.1)
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
## 2020-03-22 ENCOUNTER — Ambulatory Visit (INDEPENDENT_AMBULATORY_CARE_PROVIDER_SITE_OTHER): Payer: Medicare HMO

## 2020-03-22 ENCOUNTER — Ambulatory Visit: Payer: Medicare HMO | Admitting: Orthopaedic Surgery

## 2020-03-22 ENCOUNTER — Encounter: Payer: Self-pay | Admitting: Orthopaedic Surgery

## 2020-03-22 ENCOUNTER — Other Ambulatory Visit: Payer: Self-pay

## 2020-03-22 DIAGNOSIS — G8929 Other chronic pain: Secondary | ICD-10-CM

## 2020-03-22 DIAGNOSIS — M25511 Pain in right shoulder: Secondary | ICD-10-CM
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
## 2020-03-24 ENCOUNTER — Other Ambulatory Visit: Payer: Self-pay | Admitting: Family Medicine

## 2020-03-25 ENCOUNTER — Other Ambulatory Visit: Payer: Self-pay

## 2020-03-25 DIAGNOSIS — G894 Chronic pain syndrome: Secondary | ICD-10-CM

## 2020-03-25 MED ORDER — HYDROCODONE-ACETAMINOPHEN 5-325 MG PO TABS: 1.0000 | ORAL_TABLET | Freq: Four times a day (QID) | ORAL | 0 refills | Status: DC | PRN

## 2020-04-07 ENCOUNTER — Other Ambulatory Visit: Payer: Self-pay

## 2020-04-07 ENCOUNTER — Encounter: Payer: Self-pay | Admitting: Orthopedic Surgery

## 2020-04-07 ENCOUNTER — Ambulatory Visit (INDEPENDENT_AMBULATORY_CARE_PROVIDER_SITE_OTHER): Payer: Medicare HMO | Admitting: Orthopedic Surgery

## 2020-04-07 VITALS — Ht 68.0 in | Wt 155.0 lb

## 2020-04-07 DIAGNOSIS — M12811 Other specific arthropathies, not elsewhere classified, right shoulder: Secondary | ICD-10-CM | POA: Diagnosis not present

## 2020-04-09 ENCOUNTER — Telehealth: Payer: Self-pay | Admitting: Orthopedic Surgery

## 2020-04-09 NOTE — Telephone Encounter (Signed)
Patient's daughter Lawerance Bach called to say her father would like to proceed with scheduling right shoulder surgery. Please provide surgery sheet if appropriate.

## 2020-04-10 ENCOUNTER — Encounter: Payer: Self-pay | Admitting: Orthopedic Surgery
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
## 2020-04-12 NOTE — Telephone Encounter (Signed)
See below. Please fill out blue sheet

## 2020-04-14 ENCOUNTER — Other Ambulatory Visit: Payer: Self-pay

## 2020-04-14 DIAGNOSIS — G8929 Other chronic pain: Secondary | ICD-10-CM

## 2020-04-14 NOTE — Telephone Encounter (Signed)
Blue sheet done.  He will need thin cut CT scan and then he will not need to come out for clinical recheck we can just get him scheduled.  He does however need hemoglobin A1c drawn sometime prior to the procedure.  Thank you

## 2020-04-14 NOTE — Telephone Encounter (Signed)
I have put in order for CT

## 2020-04-16 ENCOUNTER — Other Ambulatory Visit: Payer: Self-pay

## 2020-04-16 ENCOUNTER — Ambulatory Visit (INDEPENDENT_AMBULATORY_CARE_PROVIDER_SITE_OTHER): Payer: Medicare HMO | Admitting: Family Medicine

## 2020-04-16 VITALS — BP 130/80 | HR 56 | Temp 97.8°F | Ht 68.0 in | Wt 157.0 lb

## 2020-04-16 DIAGNOSIS — I4891 Unspecified atrial fibrillation: Secondary | ICD-10-CM | POA: Diagnosis not present

## 2020-04-16 DIAGNOSIS — Z794 Long term (current) use of insulin: Secondary | ICD-10-CM | POA: Diagnosis not present

## 2020-04-16 DIAGNOSIS — E119 Type 2 diabetes mellitus without complications: Secondary | ICD-10-CM

## 2020-04-16 LAB — PT WITH INR/FINGERSTICK
INR, fingerstick: 2.6 ratio — ABNORMAL HIGH
PT, fingerstick: 31.5 s — ABNORMAL HIGH (ref 10.5–13.1)
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
## 2020-04-17 LAB — COMPLETE METABOLIC PANEL WITH GFR
AG Ratio: 1.8 (calc) (ref 1.0–2.5)
ALT: 11 U/L (ref 9–46)
AST: 17 U/L (ref 10–35)
Albumin: 4.4 g/dL (ref 3.6–5.1)
Alkaline phosphatase (APISO): 69 U/L (ref 35–144)
BUN/Creatinine Ratio: 13 (calc) (ref 6–22)
BUN: 17 mg/dL (ref 7–25)
CO2: 26 mmol/L (ref 20–32)
Calcium: 9.3 mg/dL (ref 8.6–10.3)
Chloride: 101 mmol/L (ref 98–110)
Creat: 1.3 mg/dL — ABNORMAL HIGH (ref 0.70–1.18)
GFR, Est African American: 62 mL/min/{1.73_m2} (ref >=60)
GFR, Est Non African American: 54 mL/min/{1.73_m2} — ABNORMAL LOW (ref >=60)
Globulin: 2.5 g/dL (calc) (ref 1.9–3.7)
Glucose, Bld: 115 mg/dL — ABNORMAL HIGH (ref 65–99)
Potassium: 4 mmol/L (ref 3.5–5.3)
Sodium: 137 mmol/L (ref 135–146)
Total Bilirubin: 0.4 mg/dL (ref 0.2–1.2)
Total Protein: 6.9 g/dL (ref 6.1–8.1)

## 2020-04-17 LAB — CBC WITH DIFFERENTIAL/PLATELET
Absolute Monocytes: 980 cells/uL — ABNORMAL HIGH (ref 200–950)
Basophils Absolute: 43 cells/uL (ref 0–200)
Basophils Relative: 0.5 %
Eosinophils Absolute: 421 cells/uL (ref 15–500)
Eosinophils Relative: 4.9 %
HCT: 35.5 % — ABNORMAL LOW (ref 38.5–50.0)
Hemoglobin: 11.6 g/dL — ABNORMAL LOW (ref 13.2–17.1)
Lymphs Abs: 1978 cells/uL (ref 850–3900)
MCH: 29.8 pg (ref 27.0–33.0)
MCHC: 32.7 g/dL (ref 32.0–36.0)
MCV: 91.3 fL (ref 80.0–100.0)
MPV: 10.4 fL (ref 7.5–12.5)
Monocytes Relative: 11.4 %
Neutro Abs: 5177 cells/uL (ref 1500–7800)
Neutrophils Relative %: 60.2 %
Platelets: 242 10*3/uL (ref 140–400)
RBC: 3.89 10*6/uL — ABNORMAL LOW (ref 4.20–5.80)
RDW: 13.4 % (ref 11.0–15.0)
Total Lymphocyte: 23 %
WBC: 8.6 10*3/uL (ref 3.8–10.8)

## 2020-04-17 LAB — HEMOGLOBIN A1C
Hgb A1c MFr Bld: 8.3 % of total Hgb — ABNORMAL HIGH (ref <5.7)
Mean Plasma Glucose: 192 (calc)
eAG (mmol/L): 10.6 (calc)

## 2020-04-21 ENCOUNTER — Other Ambulatory Visit: Payer: Self-pay

## 2020-04-21 DIAGNOSIS — N185 Chronic kidney disease, stage 5: Secondary | ICD-10-CM

## 2020-04-21 DIAGNOSIS — E1122 Type 2 diabetes mellitus with diabetic chronic kidney disease: Secondary | ICD-10-CM

## 2020-04-21 DIAGNOSIS — I120 Hypertensive chronic kidney disease with stage 5 chronic kidney disease or end stage renal disease: Secondary | ICD-10-CM

## 2020-04-21 DIAGNOSIS — E1022 Type 1 diabetes mellitus with diabetic chronic kidney disease: Secondary | ICD-10-CM

## 2020-04-21 DIAGNOSIS — Z794 Long term (current) use of insulin: Secondary | ICD-10-CM

## 2020-04-21 DIAGNOSIS — E119 Type 2 diabetes mellitus without complications: Secondary | ICD-10-CM

## 2020-04-21 DIAGNOSIS — N183 Chronic kidney disease, stage 3 unspecified: Secondary | ICD-10-CM

## 2020-04-27 ENCOUNTER — Other Ambulatory Visit: Payer: Self-pay

## 2020-04-27 DIAGNOSIS — G894 Chronic pain syndrome: Secondary | ICD-10-CM

## 2020-04-27 MED ORDER — HYDROCODONE-ACETAMINOPHEN 5-325 MG PO TABS: 1.0000 | ORAL_TABLET | Freq: Four times a day (QID) | ORAL | 0 refills | Status: DC | PRN

## 2020-04-27 MED ORDER — ACCU-CHEK AVIVA PLUS VI STRP
ORAL_STRIP | 3 refills | Status: DC
Start: 2020-04-27 — End: 2020-08-10

## 2020-04-28 ENCOUNTER — Other Ambulatory Visit: Payer: Self-pay | Admitting: Family Medicine

## 2020-05-13 ENCOUNTER — Encounter: Payer: Self-pay | Admitting: Endocrinology

## 2020-05-17 ENCOUNTER — Ambulatory Visit: Payer: Medicare HMO | Admitting: Podiatry

## 2020-05-27 ENCOUNTER — Other Ambulatory Visit: Payer: Self-pay

## 2020-05-27 ENCOUNTER — Ambulatory Visit (INDEPENDENT_AMBULATORY_CARE_PROVIDER_SITE_OTHER): Payer: Medicare HMO | Admitting: Family Medicine

## 2020-05-27 ENCOUNTER — Encounter: Payer: Self-pay | Admitting: Family Medicine

## 2020-05-27 VITALS — BP 128/88 | HR 50 | Temp 98.1°F | Resp 14 | Ht 68.0 in | Wt 151.0 lb

## 2020-05-27 DIAGNOSIS — Z7901 Long term (current) use of anticoagulants: Secondary | ICD-10-CM | POA: Diagnosis not present

## 2020-05-27 DIAGNOSIS — I4891 Unspecified atrial fibrillation: Secondary | ICD-10-CM

## 2020-05-27 LAB — PT WITH INR/FINGERSTICK
INR, fingerstick: 3 ratio — ABNORMAL HIGH
PT, fingerstick: 36.1 s — ABNORMAL HIGH (ref 10.5–13.1)

## 2020-05-27 MED ORDER — LOSARTAN POTASSIUM 50 MG PO TABS: 50.0000 mg | ORAL_TABLET | Freq: Every day | ORAL | 3 refills | Status: DC

## 2020-05-27 MED ORDER — PIOGLITAZONE HCL 30 MG PO TABS
30.0000 mg | ORAL_TABLET | Freq: Every day | ORAL | 3 refills | Status: DC
Start: 2020-05-27 — End: 2020-07-28

## 2020-05-27 MED ORDER — SILVER SULFADIAZINE 1 % EX CREA
1.0000 | TOPICAL_CREAM | Freq: Every day | CUTANEOUS | 0 refills | Status: DC
Start: 2020-05-27 — End: 2022-08-08
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
## 2020-05-31 ENCOUNTER — Other Ambulatory Visit: Payer: Self-pay | Admitting: *Deleted

## 2020-05-31 DIAGNOSIS — G894 Chronic pain syndrome: Secondary | ICD-10-CM

## 2020-05-31 MED ORDER — HYDROCODONE-ACETAMINOPHEN 5-325 MG PO TABS: 1.0000 | ORAL_TABLET | Freq: Four times a day (QID) | ORAL | 0 refills | Status: DC | PRN

## 2020-05-31 NOTE — Telephone Encounter (Signed)
Received call from patient.   Requested refill on Hydrocodone/APAP.   Ok to refill??  Last office visit 05/27/2020.  Last refill 04/27/2020.

## 2020-06-25 ENCOUNTER — Telehealth: Payer: Self-pay

## 2020-06-25 NOTE — Telephone Encounter (Signed)
Patient had order put in for CT scan but it looks like it has been marked as do not schedule? Can you please let me know status of scan? Patient will need surgery but unable to proceed until patient has scan.[name]

## 2020-06-28 ENCOUNTER — Other Ambulatory Visit: Payer: Self-pay | Admitting: Family Medicine

## 2020-06-28 DIAGNOSIS — G894 Chronic pain syndrome: Secondary | ICD-10-CM

## 2020-06-28 MED ORDER — WARFARIN SODIUM 5 MG PO TABS: 10.0000 mg | ORAL_TABLET | Freq: Every day | ORAL | 3 refills | Status: DC

## 2020-06-28 NOTE — Telephone Encounter (Signed)
I cancelled it because it was a duplicate order which CB had placed also in Oct then GD placed one a  month later. I have reopened GD order and will try to get him scheduled.

## 2020-06-28 NOTE — Telephone Encounter (Signed)
Ok to refill??  Last office visit 05/27/2020.  Last refill 05/31/2020.

## 2020-06-28 NOTE — Telephone Encounter (Signed)
Pt called about needing a refill of  warfarin (COUMADIN) 5 MG tablet [[id number]]  HYDROcodone-acetaminophen (NORCO/VICODIN) 5-325 MG tablet Cb#: [phone]

## 2020-06-29 MED ORDER — HYDROCODONE-ACETAMINOPHEN 5-325 MG PO TABS: 1.0000 | ORAL_TABLET | Freq: Four times a day (QID) | ORAL | 0 refills | Status: DC | PRN

## 2020-07-08 ENCOUNTER — Ambulatory Visit: Payer: Medicare HMO | Admitting: Family Medicine

## 2020-07-12 ENCOUNTER — Ambulatory Visit: Payer: Medicare HMO | Admitting: Endocrinology

## 2020-07-13 ENCOUNTER — Other Ambulatory Visit: Payer: Self-pay

## 2020-07-13 ENCOUNTER — Ambulatory Visit (INDEPENDENT_AMBULATORY_CARE_PROVIDER_SITE_OTHER): Payer: Medicare HMO | Admitting: Family Medicine

## 2020-07-13 VITALS — BP 134/74 | HR 64 | Temp 97.9°F | Resp 15 | Ht 68.0 in | Wt 159.0 lb

## 2020-07-13 DIAGNOSIS — I4891 Unspecified atrial fibrillation: Secondary | ICD-10-CM | POA: Diagnosis not present
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
## 2020-07-17 ENCOUNTER — Other Ambulatory Visit: Payer: Self-pay | Admitting: Family Medicine

## 2020-07-19 ENCOUNTER — Telehealth: Payer: Self-pay | Admitting: Cardiovascular Disease

## 2020-07-19 NOTE — Telephone Encounter (Signed)
Patient has been contacted at least 3 times for a recall, recall has been deleted

## 2020-07-28 ENCOUNTER — Other Ambulatory Visit: Payer: Self-pay

## 2020-07-28 DIAGNOSIS — G894 Chronic pain syndrome: Secondary | ICD-10-CM

## 2020-07-29 MED ORDER — PIOGLITAZONE HCL 30 MG PO TABS
30.0000 mg | ORAL_TABLET | Freq: Every day | ORAL | 3 refills | Status: DC
Start: 2020-07-29 — End: 2021-03-08

## 2020-07-29 MED ORDER — HYDROCODONE-ACETAMINOPHEN 5-325 MG PO TABS: 1.0000 | ORAL_TABLET | Freq: Four times a day (QID) | ORAL | 0 refills | Status: DC | PRN

## 2020-08-06 ENCOUNTER — Telehealth: Payer: Self-pay

## 2020-08-06 DIAGNOSIS — G894 Chronic pain syndrome: Secondary | ICD-10-CM

## 2020-08-06 NOTE — Telephone Encounter (Signed)
Medication filled on 07/29/2020.  Call placed to patient and patient made aware.

## 2020-08-06 NOTE — Telephone Encounter (Signed)
Patient called need med refill HYDROcodone-acetaminophen (NORCO/VICODIN) 5-325 MG tablet   [place], [place] [address]  [place], [place] [address]

## 2020-08-10 ENCOUNTER — Other Ambulatory Visit: Payer: Self-pay | Admitting: Family Medicine

## 2020-08-24 ENCOUNTER — Other Ambulatory Visit: Payer: Medicare HMO

## 2020-08-24 ENCOUNTER — Other Ambulatory Visit: Payer: Self-pay

## 2020-08-24 DIAGNOSIS — Z794 Long term (current) use of insulin: Secondary | ICD-10-CM | POA: Diagnosis not present

## 2020-08-24 DIAGNOSIS — E785 Hyperlipidemia, unspecified: Secondary | ICD-10-CM

## 2020-08-24 DIAGNOSIS — N185 Chronic kidney disease, stage 5: Secondary | ICD-10-CM | POA: Diagnosis not present

## 2020-08-24 DIAGNOSIS — I120 Hypertensive chronic kidney disease with stage 5 chronic kidney disease or end stage renal disease: Secondary | ICD-10-CM | POA: Diagnosis not present

## 2020-08-24 DIAGNOSIS — E038 Other specified hypothyroidism: Secondary | ICD-10-CM | POA: Diagnosis not present

## 2020-08-24 DIAGNOSIS — E119 Type 2 diabetes mellitus without complications: Secondary | ICD-10-CM

## 2020-08-24 DIAGNOSIS — E1122 Type 2 diabetes mellitus with diabetic chronic kidney disease: Secondary | ICD-10-CM | POA: Diagnosis not present

## 2020-08-24 DIAGNOSIS — I10 Essential (primary) hypertension: Secondary | ICD-10-CM | POA: Diagnosis not present

## 2020-08-24 DIAGNOSIS — N183 Chronic kidney disease, stage 3 unspecified: Secondary | ICD-10-CM | POA: Diagnosis not present

## 2020-08-25 LAB — CBC WITH DIFFERENTIAL/PLATELET
Absolute Monocytes: 1014 cells/uL — ABNORMAL HIGH (ref 200–950)
Basophils Absolute: 37 cells/uL (ref 0–200)
Basophils Relative: 0.4 %
Eosinophils Absolute: 251 cells/uL (ref 15–500)
Eosinophils Relative: 2.7 %
HCT: 34.1 % — ABNORMAL LOW (ref 38.5–50.0)
Hemoglobin: 11.4 g/dL — ABNORMAL LOW (ref 13.2–17.1)
Lymphs Abs: 1702 cells/uL (ref 850–3900)
MCH: 31 pg (ref 27.0–33.0)
MCHC: 33.4 g/dL (ref 32.0–36.0)
MCV: 92.7 fL (ref 80.0–100.0)
MPV: 10.6 fL (ref 7.5–12.5)
Monocytes Relative: 10.9 %
Neutro Abs: 6296 cells/uL (ref 1500–7800)
Neutrophils Relative %: 67.7 %
Platelets: 230 10*3/uL (ref 140–400)
RBC: 3.68 10*6/uL — ABNORMAL LOW (ref 4.20–5.80)
RDW: 14 % (ref 11.0–15.0)
Total Lymphocyte: 18.3 %
WBC: 9.3 10*3/uL (ref 3.8–10.8)

## 2020-08-25 LAB — COMPLETE METABOLIC PANEL WITH GFR
AG Ratio: 1.6 (calc) (ref 1.0–2.5)
ALT: 10 U/L (ref 9–46)
AST: 16 U/L (ref 10–35)
Albumin: 4.1 g/dL (ref 3.6–5.1)
Alkaline phosphatase (APISO): 53 U/L (ref 35–144)
BUN/Creatinine Ratio: 14 (calc) (ref 6–22)
BUN: 18 mg/dL (ref 7–25)
CO2: 23 mmol/L (ref 20–32)
Calcium: 9.3 mg/dL (ref 8.6–10.3)
Chloride: 100 mmol/L (ref 98–110)
Creat: 1.31 mg/dL — ABNORMAL HIGH (ref 0.70–1.18)
GFR, Est African American: 61 mL/min/{1.73_m2} (ref >=60)
GFR, Est Non African American: 53 mL/min/{1.73_m2} — ABNORMAL LOW (ref >=60)
Globulin: 2.5 g/dL (calc) (ref 1.9–3.7)
Glucose, Bld: 147 mg/dL — ABNORMAL HIGH (ref 65–99)
Potassium: 4.3 mmol/L (ref 3.5–5.3)
Sodium: 135 mmol/L (ref 135–146)
Total Bilirubin: 0.6 mg/dL (ref 0.2–1.2)
Total Protein: 6.6 g/dL (ref 6.1–8.1)

## 2020-08-25 LAB — HEMOGLOBIN A1C
Hgb A1c MFr Bld: 8.1 % of total Hgb — ABNORMAL HIGH (ref <5.7)
Mean Plasma Glucose: 186 mg/dL
eAG (mmol/L): 10.3 mmol/L

## 2020-08-25 LAB — LIPID PANEL
Cholesterol: 169 mg/dL (ref <200)
HDL: 60 mg/dL (ref >=40)
LDL Cholesterol (Calc): 93 mg/dL (calc)
Non-HDL Cholesterol (Calc): 109 mg/dL (calc) (ref <130)
Total CHOL/HDL Ratio: 2.8 (calc) (ref <5.0)
Triglycerides: 72 mg/dL (ref <150)

## 2020-08-25 LAB — MICROALBUMIN / CREATININE URINE RATIO
Creatinine, Urine: 113 mg/dL (ref 20–320)
Microalb Creat Ratio: 12 mcg/mg creat (ref <30)
Microalb, Ur: 1.4 mg/dL

## 2020-08-25 LAB — TSH: TSH: 4.03 mIU/L (ref 0.40–4.50)

## 2020-09-14 ENCOUNTER — Telehealth: Payer: Self-pay | Admitting: Pharmacist

## 2020-09-14 NOTE — Progress Notes (Addendum)
Chronic Care Management Pharmacy Assistant   Name: Zachary Reed.  MRN: [id number] DOB: 1945/06/13  Reason for Encounter: Disease State For DM.   Conditions to be addressed/monitored: Hypertension, Afib, Type II DM, hyperlipidemia, thyroid disease.  Recent office visits:  07/13/20 Dr. Dennard Schaumann For AFIB.Per note: Patient has decided to STOP Coumadin and DECLINES Eliquis and is choosing to START Aspirin 325 mg daily.  05/27/20 Dr. Dennard Schaumann. For AFIB and Anticoagulant. INCREASED Metformin to 1000 mg twice daily and INCREASED Actos to 30 mg daily. Per note: Patient refuses to see an endocrinologist. 04/16/20 Dr. Dennard Schaumann For AFIB amd Type 2 DM. Labs drawn and INR checked. Per note: They are in the process of trying to clear the patient for surgery, patient would possibly need a referral to a endocrinology. No medication changes.  03/05/20 Dr. Dennard Schaumann For AFIB. INR was checked. Per note:patient would like to see an orthopedic surgeon to discuss the pain in his right shoulder No medication changes.  01/23/20 Dt. Pickard For AFIB. INR was checked. No medication changes. 12/12/19 Dr. Dennard Schaumann For AFIB. INR was checked. Per note: The doctor continue to strongly recommend orthopedics consultation/MRI or physical therapy. Patient continues to decline both.  No medication changes.  Recent consult visits:  04/07/20 Orthopedics Marlou Sa, Tonna Corner, MD. For Right shoulder pain. Per note: Patient declined surgery but on 04/09/20 he called and decided to go fourth with the surge. No medication changes. 03/22/20 Orthopedics Mcarthur Rossetti, MD. For Chronic Right shoulder pain. Xray and CT preformed. No medication changes.  01/15/20 Podiatry Gardiner Barefoot, DPM. For Onychomycosis. Per note: Patient cut his right toe on the lawnmower, cut is improving. STARTED Doxycycline 100 mg 2 times daily.   Hospital visits:  None in previous 6 months  Medications: Outpatient Encounter Medications as of 09/14/2020   Medication Sig   Accu-Chek FastClix Lancets MISC USE AS DIRECTED   Ascorbic Acid (VITAMIN C) 1000 MG tablet Take 1,000 mg by mouth daily.   b complex vitamins tablet Take 1 tablet by mouth daily.   Blood Glucose Calibration (ACCU-CHEK AVIVA) SOLN Use as directed to calibrate glucometer   Blood Glucose Monitoring Suppl (ACCU-CHEK AVIVA PLUS) w/Device KIT CHECK BLOOD SUGAR FOUR TIMES DAILY DX: E11.9   Cholecalciferol (VITAMIN D3) 250 MCG (10000 UT) capsule Take 10,000 Units by mouth daily.   diltiazem (CARDIZEM CD) 120 MG 24 hr capsule Take 1 capsule (120 mg total) by mouth daily.   glucagon (GLUCAGON EMERGENCY) 1 MG injection Inject 1 mg into the vein once as needed for up to 1 dose.   glucose blood (ACCU-CHEK AVIVA PLUS) test strip TEST BLOOD SUGAR FOUR TIMES DAILY BEFORE MEALS  AND AT BEDTIME   glucose blood test strip 1 each by Other route as needed for other. Use as instructed   HYDROcodone-acetaminophen (NORCO/VICODIN) 5-325 MG tablet Take 1 tablet by mouth every 6 (six) hours as needed for moderate pain.   insulin aspart (NOVOLOG) 100 UNIT/ML injection INJECT 4 UNITS SUBCUTANEOUSLY THREE TIMES DAILY  WITH  EACH MEAL, DISCARD VIAL 28 DAYS AFTER OPENING   insulin glargine (LANTUS) 100 UNIT/ML injection Inject 0.12 mLs (12 Units total) into the skin at bedtime. (Patient taking differently: Inject 15 Units into the skin at bedtime.)   Lancets Misc. (ACCU-CHEK FASTCLIX LANCET) KIT Use to check blood sugar   levothyroxine (SYNTHROID) 100 MCG tablet TAKE 1 TABLET EVERY DAY   lisinopril (ZESTRIL) 10 MG tablet TAKE 1 TABLET EVERY DAY   lisinopril (ZESTRIL) 20 MG  tablet Take 1 tablet (20 mg total) by mouth daily.   losartan (COZAAR) 50 MG tablet Take 1 tablet (50 mg total) by mouth daily.   metFORMIN (GLUCOPHAGE) 500 MG tablet TAKE 1 TABLET TWICE DAILY WITH MEALS   pioglitazone (ACTOS) 30 MG tablet Take 1 tablet (30 mg total) by mouth daily.   sildenafil (REVATIO) 20 MG tablet TAKE 1 TABLET BY  MOUTH 30 MINUTES BEFORE SEXUAL INTERCOURSE AS DIRECTED   sildenafil (VIAGRA) 100 MG tablet Take 0.5-1 tablets (50-100 mg total) by mouth daily as needed for erectile dysfunction.   silver sulfADIAZINE (SILVADENE) 1 % cream Apply 1 application topically daily.   simvastatin (ZOCOR) 10 MG tablet TAKE 1 TABLET EVERY DAY  AT  6:00PM   warfarin (COUMADIN) 5 MG tablet Take 2 tablets (10 mg total) by mouth daily. 5 mg [facility] 7.39m TF   zinc gluconate 50 MG tablet Take 50 mg by mouth daily.   No facility-administered encounter medications on file as of 09/14/2020.    Recent Relevant Labs: Lab Results  Component Value Date/Time   HGBA1C 8.1 (H) 08/24/2020 02:22 PM   HGBA1C 8.3 (H) 04/16/2020 02:37 PM   HGBA1C 7.8 (H) 11/23/2016 03:52 PM   HGBA1C 7.3 (H) 02/24/2016 03:54 PM   MICROALBUR 1.4 08/24/2020 02:22 PM   MICROALBUR CANCELED 06/13/2018 03:29 PM   MICROALBUR <0.2 06/13/2018 03:29 PM    Kidney Function Lab Results  Component Value Date/Time   CREATININE 1.31 (H) 08/24/2020 02:22 PM   CREATININE 1.30 (H) 04/16/2020 02:37 PM   GFRNONAA 53 (L) 08/24/2020 02:22 PM   GFRAA 61 08/24/2020 02:22 PM    Current antihyperglycemic regimen:  Metformin 500 mg 2 tablet twice daily Pioglitazone 30 mg 1 tablet dail daily Novolog 100 unit/ml ;  INJECT 4 UNITS SUBCUTANEOUSLY THREE TIMES DAILY  WITH  EACH MEAL, DISCARD VIAL 28 DAYS AFTER OPENING (Patient stated he either takes 3-5 units depending on his readings) Lantus 100 unit/ml ; Inject 0.12 mLs (12 Units total) into the skin at bedtime. (Patient stated he uses 15 units at bedtime)  What recent interventions/DTPs have been made to improve glycemic control:  INCREASED metformin to 2 tablet twice daily  Have there been any recent hospitalizations or ED visits since last visit with CPP? Patient stated no.   Patient reports hypoglycemic symptoms, including Shaky   Patient reports hyperglycemic symptoms, including polyuria  How often are you  checking your blood sugar? 3-4 times daily   What are your blood sugars ranging?  08/2120 (throughout the day) 12:51AM  154 09/16/20 164  09/16/20 134 09/16/20 176 09/16/20 137 09/16/20 145  During the week, how often does your blood glucose drop below 70?  Patient stated it is happening about Twice a week .  Are you checking your feet daily/regularly?  Patient stated he checks his feet regularly.  Adherence Review: Is the patient currently on a STATIN medication? Simvastatin 10 mg   Is the patient currently on ACE/ARB medication?   Losartan 50 mg Lisinopril 10 - 20 mg ?  Does the patient have >5 day gap between last estimated fill dates? Per misc rpts, no.  Star Rating Drugs: Lisinopril 10 - 20 mg 90 DS 04/29/20 Losartan 50 mg 90 DS 08/12/20, Metformin 500 mg 90 DS 07/20/20 Pioglitazone 30 mg 90 DS 08/10/20  Simvastatin 10 mg 90 DS 04/27/20  Follow-Up:Pharmacist Review   Follow-Up:Pharmacist Review  VCharlann Lange RMA Clinical Pharmacist Assistant [phone] 10 minutes spent in review, coordination, and documentation.  Reviewed by: Beverly Milch, PharmD Clinical Pharmacist [facility] [phone]

## 2020-09-15 ENCOUNTER — Telehealth: Payer: Self-pay | Admitting: Family Medicine

## 2020-09-15 NOTE — Telephone Encounter (Signed)
Received fax from [facility] requesting refill on metFORMIN (GLUCOPHAGE) 500 MG tablet

## 2020-09-16 ENCOUNTER — Telehealth: Payer: Self-pay | Admitting: Family Medicine

## 2020-09-16 NOTE — Telephone Encounter (Signed)
Faxed new RX request for  metFORMIN (GLUCOPHAGE) 500 MG

## 2020-09-17 ENCOUNTER — Other Ambulatory Visit: Payer: Self-pay

## 2020-09-17 ENCOUNTER — Other Ambulatory Visit: Payer: Self-pay | Admitting: Family Medicine

## 2020-09-17 DIAGNOSIS — G894 Chronic pain syndrome: Secondary | ICD-10-CM

## 2020-09-17 MED ORDER — HYDROCODONE-ACETAMINOPHEN 5-325 MG PO TABS: 1.0000 | ORAL_TABLET | Freq: Four times a day (QID) | ORAL | 0 refills | Status: DC | PRN

## 2020-09-17 MED ORDER — METFORMIN HCL 500 MG PO TABS: 1.0000 | ORAL_TABLET | Freq: Two times a day (BID) | ORAL | 2 refills | Status: DC

## 2020-09-17 NOTE — Telephone Encounter (Signed)
sent 

## 2020-09-23 ENCOUNTER — Other Ambulatory Visit: Payer: Self-pay | Admitting: Family Medicine

## 2020-10-18 ENCOUNTER — Encounter: Payer: Self-pay | Admitting: Podiatry

## 2020-10-18 ENCOUNTER — Ambulatory Visit: Payer: Medicare HMO | Admitting: Podiatry

## 2020-10-18 ENCOUNTER — Other Ambulatory Visit: Payer: Self-pay

## 2020-10-18 DIAGNOSIS — I739 Peripheral vascular disease, unspecified: Secondary | ICD-10-CM | POA: Diagnosis not present

## 2020-10-18 DIAGNOSIS — M79674 Pain in right toe(s): Secondary | ICD-10-CM | POA: Diagnosis not present

## 2020-10-18 DIAGNOSIS — D2371 Other benign neoplasm of skin of right lower limb, including hip: Secondary | ICD-10-CM | POA: Diagnosis not present

## 2020-10-18 DIAGNOSIS — B351 Tinea unguium: Secondary | ICD-10-CM | POA: Diagnosis not present

## 2020-10-18 DIAGNOSIS — E0840 Diabetes mellitus due to underlying condition with diabetic neuropathy, unspecified: Secondary | ICD-10-CM | POA: Diagnosis not present

## 2020-10-18 DIAGNOSIS — L97521 Non-pressure chronic ulcer of other part of left foot limited to breakdown of skin: Secondary | ICD-10-CM

## 2020-10-18 DIAGNOSIS — D2372 Other benign neoplasm of skin of left lower limb, including hip: Secondary | ICD-10-CM

## 2020-10-18 DIAGNOSIS — M79675 Pain in left toe(s): Secondary | ICD-10-CM

## 2020-10-18 DIAGNOSIS — Z794 Long term (current) use of insulin: Secondary | ICD-10-CM

## 2020-10-18 NOTE — Progress Notes (Signed)
He presents today concerned about severe pain to the fifth and fourth digits of the left foot where he trimmed a callus with a pocket knife and states that it seems to be very painful now.  His toenails are long and painful.  Objective: Nonpalpable pulses bilaterally.  He has a healing wound to the fifth digit left foot that is exquisitely tender on palpation.  Capillary fill time is immediate but this does appear to be ischemic in nature.  Otherwise toenails are long thick yellow dystrophic clinically mycotic.  Assessment: Pain in limb secondary to peripheral vascular disease.  Pain limb secondary onychomycosis.  Plan: Requesting ABIs immediately.  I debrided his nails for him today.  Follow-up with me once we have the results back.

## 2020-10-20 ENCOUNTER — Other Ambulatory Visit: Payer: Self-pay | Admitting: Family Medicine

## 2020-10-20 DIAGNOSIS — G894 Chronic pain syndrome: Secondary | ICD-10-CM

## 2020-10-20 NOTE — Telephone Encounter (Signed)
Patient called to request refill of  HYDROcodone-acetaminophen (NORCO/VICODIN) 5-325 MG tablet [[id number]]   Pharmacy confirmed as [facility] [address], [place] - [place]  [address] [place] [address]  Phone:  [phone] Fax:  [phone]   Please advise patient when Rx called in at [phone].

## 2020-10-20 NOTE — Telephone Encounter (Signed)
Ok to refill??  Last office visit 07/13/2020.  Last refill 09/17/2020.

## 2020-10-21 MED ORDER — HYDROCODONE-ACETAMINOPHEN 5-325 MG PO TABS: 1.0000 | ORAL_TABLET | Freq: Four times a day (QID) | ORAL | 0 refills | Status: DC | PRN

## 2020-10-27 ENCOUNTER — Ambulatory Visit (HOSPITAL_COMMUNITY)
Admission: RE | Admit: 2020-10-27 | Discharge: 2020-10-27 | Disposition: A | Discharge status: Home / Self Care | Payer: Medicare HMO | Source: Ambulatory Visit | Attending: Podiatry | Admitting: Podiatry

## 2020-10-27 ENCOUNTER — Telehealth: Payer: Self-pay | Admitting: *Deleted

## 2020-10-27 ENCOUNTER — Other Ambulatory Visit: Payer: Self-pay

## 2020-10-27 DIAGNOSIS — L97521 Non-pressure chronic ulcer of other part of left foot limited to breakdown of skin: Secondary | ICD-10-CM

## 2020-10-27 DIAGNOSIS — Z794 Long term (current) use of insulin: Secondary | ICD-10-CM | POA: Diagnosis not present

## 2020-10-27 DIAGNOSIS — I739 Peripheral vascular disease, unspecified: Secondary | ICD-10-CM | POA: Diagnosis not present

## 2020-10-27 DIAGNOSIS — E0840 Diabetes mellitus due to underlying condition with diabetic neuropathy, unspecified: Secondary | ICD-10-CM | POA: Insufficient documentation

## 2020-10-27 NOTE — Telephone Encounter (Signed)
Placed orders for vascular consult  

## 2020-10-27 NOTE — Telephone Encounter (Signed)
-----   Message from Garrel Ridgel, [place] sent at 10/27/2020  4:53 PM EDT ----- He needs to be referred back to vascular for consult.

## 2020-10-28 ENCOUNTER — Telehealth: Payer: Self-pay | Admitting: Pharmacist

## 2020-10-28 NOTE — Progress Notes (Addendum)
Chronic Care Management Pharmacy Assistant   Name: Zachary Reed.  MRN: [id number] DOB: 28-Jan-1946  Reason for Encounter: Disease State For HTN.   Conditions to be addressed/monitored: Hypertension, Afib, Type II DM, hyperlipidemia, thyroid disease.  Recent office visits:  None since 09/14/20  Recent consult visits:  10/18/20 Podiatry Cor[place], Max T, DPM. For follow-up. No medication changes.  Hospital visits:  None since 09/14/20  Medications: Outpatient Encounter Medications as of 10/28/2020  Medication Sig   Accu-Chek FastClix Lancets MISC USE AS DIRECTED   Ascorbic Acid (VITAMIN C) 1000 MG tablet Take 1,000 mg by mouth daily.   b complex vitamins tablet Take 1 tablet by mouth daily.   Blood Glucose Calibration (ACCU-CHEK AVIVA) SOLN Use as directed to calibrate glucometer   Blood Glucose Monitoring Suppl (ACCU-CHEK AVIVA PLUS) w/Device KIT CHECK BLOOD SUGAR FOUR TIMES DAILY DX: E11.9   Cholecalciferol (VITAMIN D3) 250 MCG (10000 UT) capsule Take 10,000 Units by mouth daily.   diltiazem (CARDIZEM CD) 120 MG 24 hr capsule Take 1 capsule (120 mg total) by mouth daily.   glucagon (GLUCAGON EMERGENCY) 1 MG injection Inject 1 mg into the vein once as needed for up to 1 dose.   glucose blood (ACCU-CHEK AVIVA PLUS) test strip TEST BLOOD SUGAR FOUR TIMES DAILY (BEFORE MEALS AND AT BEDTIME)   glucose blood test strip 1 each by Other route as needed for other. Use as instructed   HYDROcodone-acetaminophen (NORCO/VICODIN) 5-325 MG tablet Take 1 tablet by mouth every 6 (six) hours as needed for moderate pain.   insulin aspart (NOVOLOG) 100 UNIT/ML injection INJECT 4 UNITS SUBCUTANEOUSLY THREE TIMES DAILY  WITH  EACH MEAL, DISCARD VIAL 28 DAYS AFTER OPENING   insulin glargine (LANTUS) 100 UNIT/ML injection Inject 0.12 mLs (12 Units total) into the skin at bedtime. (Patient taking differently: Inject 15 Units into the skin at bedtime.)   Lancets Misc. (ACCU-CHEK FASTCLIX LANCET) KIT Use to check  blood sugar   levothyroxine (SYNTHROID) 100 MCG tablet TAKE 1 TABLET EVERY DAY   lisinopril (ZESTRIL) 10 MG tablet TAKE 1 TABLET EVERY DAY   lisinopril (ZESTRIL) 20 MG tablet Take 1 tablet (20 mg total) by mouth daily.   losartan (COZAAR) 50 MG tablet Take 1 tablet (50 mg total) by mouth daily.   metFORMIN (GLUCOPHAGE) 500 MG tablet Take 1 tablet (500 mg total) by mouth 2 (two) times daily with a meal.   pioglitazone (ACTOS) 30 MG tablet Take 1 tablet (30 mg total) by mouth daily.   sildenafil (REVATIO) 20 MG tablet TAKE 1 TABLET BY MOUTH 30 MINUTES BEFORE SEXUAL INTERCOURSE AS DIRECTED   sildenafil (VIAGRA) 100 MG tablet Take 0.5-1 tablets (50-100 mg total) by mouth daily as needed for erectile dysfunction.   silver sulfADIAZINE (SILVADENE) 1 % cream Apply 1 application topically daily.   simvastatin (ZOCOR) 10 MG tablet TAKE 1 TABLET EVERY DAY  AT  6:00PM   warfarin (COUMADIN) 5 MG tablet Take 2 tablets (10 mg total) by mouth daily. 5 mg [facility] 7.$RemoveBe'5mg'[id number]$  TF   zinc gluconate 50 MG tablet Take 50 mg by mouth daily.   No facility-administered encounter medications on file as of 10/28/2020.   Reviewed chart prior to disease state call. Spoke with patient regarding BP  Recent Office Vitals: BP Readings from Last 3 Encounters:  07/13/20 134/74  05/27/20 128/88  04/16/20 130/80   Pulse Readings from Last 3 Encounters:  07/13/20 64  05/27/20 (!) 50  04/16/20 (!) 56  Wt Readings from Last 3 Encounters:  07/13/20 159 lb (72.1 kg)  05/27/20 151 lb (68.5 kg)  04/16/20 157 lb (71.2 kg)     Kidney Function Lab Results  Component Value Date/Time   CREATININE 1.31 (H) 08/24/2020 02:22 PM   CREATININE 1.30 (H) 04/16/2020 02:37 PM   GFRNONAA 53 (L) 08/24/2020 02:22 PM   GFRAA 61 08/24/2020 02:22 PM    BMP Latest Ref Rng & Units 08/24/2020 04/16/2020 08/14/2019  Glucose 65 - 99 mg/dL 147(H) 115(H) 134(H)  BUN 7 - 25 mg/dL $Remove'18 17 20  'YVZjBRz$ Creatinine 0.70 - 1.18 mg/dL 1.31(H) 1.30(H) 1.25(H)   BUN/Creat Ratio 6 - 22 (calc) $RemoveB'14 13 16  'bFMZWdao$ Sodium 135 - 146 mmol/L 135 137 140  Potassium 3.5 - 5.3 mmol/L 4.3 4.0 4.2  Chloride 98 - 110 mmol/L 100 101 105  CO2 20 - 32 mmol/L $RemoveB'23 26 27  'mkzDxVCU$ Calcium 8.6 - 10.3 mg/dL 9.3 9.3 9.4    Current antihypertensive regimen:  Losartan 50 mg  Daily  Diltiazem 120 mg  Daily   How often are you checking your Blood Pressure? Patient stated he does not check his blood pressure but we agreed he would start.   Current home BP readings: N/A  What recent interventions/DTPs have been made by any provider to improve Blood Pressure control since last CPP Visit: None.  Any recent hospitalizations or ED visits since last visit with CPP? Patient stated no.   What diet changes have been made to improve Blood Pressure Control?  Patient stated his diet has not changed much but he does watch what he eats because he is a diabetic.   What exercise is being done to improve your Blood Pressure Control?  Patient stated he works 5 days a week and also does yard work.   Adherence Review: Is the patient currently on ACE/ARB medication? Losartan 50 mg   Does the patient have >5 day gap between last estimated fill dates? Per misc rpts, no.  Star Rating Drugs: Losartan 50 mg 90 DS 08/12/20, Metformin 500 mg 90 DS 07/20/20 Pioglitazone 30 mg 90 DS 08/10/20  Simvastatin 10 mg 90 DS 04/27/20  Follow-Up:Pharmacist Review  Charlann Lange, RMA Clinical Pharmacist Assistant [phone]  10 minutes spent in review, coordination, and documentation.  Reviewed by: Beverly Milch, PharmD Clinical Pharmacist [facility] [phone]

## 2020-11-12 NOTE — Progress Notes (Deleted)
Chronic Care Management Pharmacy Note  11/12/2020 Name:  Zachary Reed. MRN:  [id number] DOB:  [birth year]  Summary:   Recommendations/Changes made from today's visit:   Plan:    Subjective: Zachary Halt. is an 75 y.o. year old male who is a primary patient of Pickard, Cammie Mcgee, MD.  The CCM team was consulted for assistance with disease management and care coordination needs.    Engaged with patient by telephone for follow up visit in response to provider referral for pharmacy case management and/or care coordination services.   Consent to Services:  The patient was given the following information about Chronic Care Management services today, agreed to services, and gave verbal consent: 1. CCM service includes personalized support from designated clinical staff supervised by the primary care provider, including individualized plan of care and coordination with other care providers 2. 24/7 contact phone numbers for assistance for urgent and routine care needs. 3. Service will only be billed when office clinical staff spend 20 minutes or more in a month to coordinate care. 4. Only one practitioner may furnish and bill the service in a calendar month. 5.The patient may stop CCM services at any time (effective at the end of the month) by phone call to the office staff. 6. The patient will be responsible for cost sharing (co-pay) of up to 20% of the service fee (after annual deductible is met). Patient agreed to services and consent obtained.  Patient Care Team: Susy Frizzle, MD as PCP - General (Family Medicine) Edythe Clarity, [facility] as Pharmacist (Pharmacist)  Recent office visits: None since last CCM visit  Recent consult visits: None since last CCM visit  Hospital visits: None since last CCM visit   Objective:  Lab Results  Component Value Date   CREATININE 1.31 (H) 08/24/2020   BUN 18 08/24/2020   GFRNONAA 53 (L) 08/24/2020   GFRAA 61 08/24/2020   NA 135 08/24/2020    K 4.3 08/24/2020   CALCIUM 9.3 08/24/2020   CO2 23 08/24/2020   GLUCOSE 147 (H) 08/24/2020    Lab Results  Component Value Date/Time   HGBA1C 8.1 (H) 08/24/2020 02:22 PM   HGBA1C 8.3 (H) 04/16/2020 02:37 PM   HGBA1C 7.8 (H) 11/23/2016 03:52 PM   HGBA1C 7.3 (H) 02/24/2016 03:54 PM   MICROALBUR 1.4 08/24/2020 02:22 PM   MICROALBUR CANCELED 06/13/2018 03:29 PM   MICROALBUR <0.2 06/13/2018 03:29 PM    Last diabetic Eye exam:  Lab Results  Component Value Date/Time   HMDIABEYEEXA No Retinopathy 08/27/2014 12:00 AM    Last diabetic Foot exam: No results found for: HMDIABFOOTEX   Lab Results  Component Value Date   CHOL 169 08/24/2020   HDL 60 08/24/2020   LDLCALC 93 08/24/2020   TRIG 72 08/24/2020   CHOLHDL 2.8 08/24/2020    Hepatic Function Latest Ref Rng & Units 08/24/2020 04/16/2020 08/14/2019  Total Protein 6.1 - 8.1 g/dL 6.6 6.9 6.6  Albumin 3.6 - 5.1 g/dL - - -  AST 10 - 35 U/L _0 ALT 9 - 46 U/L _1 Alk Phosphatase 40 - 115 U/L - - -  Total Bilirubin 0.2 - 1.2 mg/dL 0.6 0.4 0.5    Lab Results  Component Value Date/Time   TSH 4.03 08/24/2020 02:22 PM   TSH 1.29 05/13/2019 02:39 PM    CBC Latest Ref Rng & Units 08/24/2020 04/16/2020 08/14/2019  WBC 3.8 - 10.8 Thousand/uL 9.3 8.6 7.4  Hemoglobin  13.2 - 17.1 g/dL 11.4(L) 11.6(L) 12.1(L)  Hematocrit 38.5 - 50.0 % 34.1(L) 35.5(L) 35.5(L)  Platelets 140 - 400 Thousand/uL 230 242 243    No results found for: VD25OH  Clinical ASCVD: {YES/NO:21197} The 10-year ASCVD risk score Mikey Bussing DC Jr., et al., 2013) is: 46.1%   Values used to calculate the score:     Age: 89 years     Sex: Male     Is Non-Hispanic African American: No     Diabetic: Yes     Tobacco smoker: No     Systolic Blood Pressure: 211 mmHg     Is BP treated: Yes     HDL Cholesterol: 60 mg/dL     Total Cholesterol: 169 mg/dL    Depression screen [facility] 2/9 05/27/2020 01/16/2019 12/06/2017  Decreased Interest 0 0 0  Down, Depressed,  Hopeless 0 0 0  PHQ - 2 Score 0 0 0  Altered sleeping - - -  Tired, decreased energy - - -  Change in appetite - - -  Feeling bad or failure about yourself  - - -  Trouble concentrating - - -  Moving slowly or fidgety/restless - - -  Suicidal thoughts - - -  PHQ-9 Score - - -  Difficult doing work/chores - - -     ***Other: (CHADS2VASc if Afib, MMRC or CAT for COPD, ACT, DEXA)  Social History   Tobacco Use  Smoking Status Former   Pack years: 0.00   Types: Cigarettes   Quit date: 09/26/1984   Years since quitting: 36.1  Smokeless Tobacco Never  Tobacco Comments   quit 1986   BP Readings from Last 3 Encounters:  07/13/20 134/74  05/27/20 128/88  04/16/20 130/80   Pulse Readings from Last 3 Encounters:  07/13/20 64  05/27/20 (!) 50  04/16/20 (!) 56   Wt Readings from Last 3 Encounters:  07/13/20 159 lb (72.1 kg)  05/27/20 151 lb (68.5 kg)  04/16/20 157 lb (71.2 kg)   BMI Readings from Last 3 Encounters:  07/13/20 24.18 kg/m  05/27/20 22.96 kg/m  04/16/20 23.87 kg/m    Assessment/Interventions: Review of patient past medical history, allergies, medications, health status, including review of consultants reports, laboratory and other test data, was performed as part of comprehensive evaluation and provision of chronic care management services.   SDOH:  (Social Determinants of Health) assessments and interventions performed: {yes/no:20286}  SDOH Screenings   Alcohol Screen: Low Risk    Last Alcohol Screening Score (AUDIT): 0  Depression (PHQ2-9): Low Risk    PHQ-2 Score: 0  Financial Resource Strain: Low Risk    Difficulty of Paying Living Expenses: Not very hard  Food Insecurity: Not on file  Housing: Not on file  Physical Activity: Not on file  Social Connections: Not on file  Stress: Not on file  Tobacco Use: Medium Risk   Smoking Tobacco Use: Former   Smokeless Tobacco Use: Never  Transportation Needs: Not on file    [place]  Allergies   Allergen Reactions   Latex    Penicillins    Shrimp [Shellfish Allergy]     Medications Reviewed Today     Reviewed by Rip Harbour, [facility] (Certified Podiatric Assistant) on 10/18/20 at [place] List Status: <None>   Medication Order Taking? Sig Documenting Provider Last Dose Status Informant  Accu-Chek FastClix Lancets MISC [id number]  USE AS DIRECTED Susy Frizzle, MD  Active   Ascorbic Acid (VITAMIN C) 1000 MG tablet  [id number] No Take 1,000 mg by mouth daily. [provider] Taking Active   b complex vitamins tablet [id number] No Take 1 tablet by mouth daily. [provider] Taking Active   Blood Glucose Calibration (ACCU-CHEK AVIVA) SOLN [id number] No Use as directed to calibrate glucometer [name], PA-C Taking Active   Blood Glucose Monitoring Suppl (ACCU-CHEK AVIVA PLUS) w/Device KIT [id number] No CHECK BLOOD SUGAR FOUR TIMES DAILY DX: E11.9 Susy Frizzle, MD Taking Active   Cholecalciferol (VITAMIN D3) 250 MCG (10000 UT) capsule [id number] No Take 10,000 Units by mouth daily. [provider] Taking Active   diltiazem (CARDIZEM CD) 120 MG 24 hr capsule [id number] No Take 1 capsule (120 mg total) by mouth daily. Susy Frizzle, MD Taking Active   glucagon [facility]) 1 MG injection [id number] No Inject 1 mg into the vein once as needed for up to 1 dose. Susy Frizzle, MD Taking Active   glucose blood (ACCU-CHEK AVIVA PLUS) test strip [id number]  TEST BLOOD SUGAR FOUR TIMES DAILY (BEFORE MEALS AND AT BEDTIME) Susy Frizzle, MD  Active   glucose blood test strip [id number] No 1 each by Other route as needed for other. Use as instructed [provider] Taking Active   HYDROcodone-acetaminophen (NORCO/VICODIN) 5-325 MG tablet [id number]  Take 1 tablet by mouth every 6 (six) hours as needed for moderate pain. Susy Frizzle, MD  Active   insulin aspart (NOVOLOG) 100 UNIT/ML injection [id number] No INJECT 4 UNITS SUBCUTANEOUSLY  THREE TIMES DAILY  WITH  EACH MEAL, DISCARD VIAL 28 DAYS AFTER OPENING Pickard, Cammie Mcgee, MD Taking Active   insulin glargine (LANTUS) 100 UNIT/ML injection [id number] No Inject 0.12 mLs (12 Units total) into the skin at bedtime.  Patient taking differently: Inject 15 Units into the skin at bedtime.   [name], PA-C Taking Active   Lancets Misc. (ACCU-CHEK FASTCLIX LANCET) KIT [id number] No Use to check blood sugar Susy Frizzle, MD Taking Active   levothyroxine (SYNTHROID) 100 MCG tablet [id number] No TAKE 1 TABLET EVERY DAY Susy Frizzle, MD Taking Active   lisinopril (ZESTRIL) 10 MG tablet [id number] No TAKE 1 TABLET EVERY DAY Susy Frizzle, MD Taking Active   lisinopril (ZESTRIL) 20 MG tablet [id number] No Take 1 tablet (20 mg total) by mouth daily. Susy Frizzle, MD Taking Active   losartan (COZAAR) 50 MG tablet [id number] No Take 1 tablet (50 mg total) by mouth daily. Susy Frizzle, MD Taking Active   metFORMIN (GLUCOPHAGE) 500 MG tablet [id number]  Take 1 tablet (500 mg total) by mouth 2 (two) times daily with a meal. Susy Frizzle, MD  Active   pioglitazone (ACTOS) 30 MG tablet [id number]  Take 1 tablet (30 mg total) by mouth daily. Susy Frizzle, MD  Active   sildenafil (REVATIO) 20 MG tablet [id number] No TAKE 1 TABLET BY MOUTH 30 MINUTES BEFORE SEXUAL INTERCOURSE AS DIRECTED Susy Frizzle, MD Taking Active   sildenafil (VIAGRA) 100 MG tablet [id number] No Take 0.5-1 tablets (50-100 mg total) by mouth daily as needed for erectile dysfunction. Susy Frizzle, MD Taking Active   silver sulfADIAZINE (SILVADENE) 1 % cream [id number] No Apply 1 application topically daily. Susy Frizzle, MD Taking Active   simvastatin (ZOCOR) 10 MG tablet [id number]  TAKE 1 TABLET EVERY DAY  AT  6:00PM Susy Frizzle, MD  Active   warfarin (COUMADIN) 5 MG tablet [id number] No Take 2 tablets (10 mg total) by mouth  daily. 5 mg [facility] 7.33m TF PSusy Frizzle MD Taking Active    zinc gluconate 50 MG tablet [id number] Take 50 mg by mouth daily. [provider] Taking Active             Patient Active Problem List   Diagnosis Date Noted   Infected laceration of skin 01/15/2020   Pain due to onychomycosis of toenails of both feet 11/11/2018   Atrial fibrillation ([place] 11/18/2015   Prostate cancer screening 08/06/2014   Diabetes mellitus type 2, insulin dependent ([place] 10/18/2012   Diabetes mellitus with neuropathy ([place] 10/18/2012   Macular degeneration    Diabetes mellitus with ophthalmic manifestations    Hypertension    Hyperlipidemia    Thyroid disease    Anemia    ED (erectile dysfunction)     Immunization History  Administered Date(s) Administered   Influenza Split 03/06/2009, 03/15/2011   Influenza-Unspecified 02/15/2017   Pneumococcal Polysaccharide-23 12/19/2000, 08/11/2011   Td 08/11/2011    Conditions to be addressed/monitored:  Hypertension, Afib, Type II DM, hyperlipidemia, thyroid disease.  There are no care plans that you recently modified to display for this patient.    Medication Assistance: {MEDASSISTANCEINFO:25044}  Compliance/Adherence/Medication fill history: Care Gaps:   Star-Rating Drugs:   Patient's preferred pharmacy is:  [facility] [place] [address]Phone: [phone]: [phone] [facility]) - [place]Phone: [phone]: [phone] [place]Phone: [phone]: [phone] [id number] [place], [place] [address] [place]one: [phone]: [phone] pill box? {Yes or If no, why not?:20788} Pt endorses ***% compliance  We discussed:  {Pharmacy options:24294} Patient decided to: {US Pharmacy Plan:23885}  Care Plan and Follow Up Patient Decision:  {FOLLOWUP:24991}  Plan: {CM FOLLOW UP PLMCNO:70962}    Current Barriers:  {pharmacybarriers:24917}  Pharmacist Clinical Goal(s):  Patient will {PHARMACYGOALCHOICES:24921} through collaboration with PharmD and provider.   Interventions: 1:1 collaboration with PiSusy FrizzleMD regarding development and update of comprehensive plan of care as evidenced by provider attestation and co-signature Inter-disciplinary care team collaboration (see longitudinal plan of care) Comprehensive medication review performed; medication list updated in electronic medical record  Hypertension (BP goal {CHL HP UPSTREAM Pharmacist BP ranges:[phone]}) -{US controlled/uncontrolled:25276} -Current treatment: Lisinopril 3042maily Diltiazem CD 120m52mily -Medications previously tried: ***  -Current home readings: *** -Current dietary habits: *** -Current exercise habits: *** -{ACTIONS;DENIES/REPORTS:21021675} hypotensive/hypertensive symptoms -Educated on {CCM BP Counseling:25124} -Counseled to monitor BP at home ***, document, and provide log at future appointments -{CCMPHARMDINTERVENTION:25122}  Hyperlipidemia: (LDL goal < 70) -{US controlled/uncontrolled:25276} -Current treatment: Simvastatin 10mg73mly -Medications previously tried: ***  -Current dietary patterns: *** -Current exercise habits: *** -Educated on {CCM HLD Counseling:25126} -{CCMPHARMDINTERVENTION:25122}  Diabetes (A1c goal {A1c goals:23924}) -{US controlled/uncontrolled:25276} -Current medications: Novolog 4 units TID with meals Lantus 12 units hs Pioglitazone 30mg 84my Metformin 500mg t35m daily -Medications previously tried: ***  -Current home glucose readings fasting glucose: *** post prandial glucose: *** -{ACTIONS;DENIES/REPORTS:21021675} hypoglycemic/hyperglycemic symptoms -Current meal  patterns:  breakfast: ***  lunch: ***  dinner: *** snacks: *** drinks: *** -Current exercise: *** -Educated on {CCM DM COUNSELING:25123} -Counseled to check feet daily and get yearly eye  exams -{CCMPHARMDINTERVENTION:25122}  Atrial Fibrillation (Goal: prevent stroke and major bleeding) -{US controlled/uncontrolled:25276} -CHADSVASC: *** -Current treatment: Rate control: Diltiazem CD 117m daily Anticoagulation: *** -Medications previously tried: *** -Home BP and HR readings: ***  -Counseled on {CCMAFIBCOUNSELING:25120} -{CCMPHARMDINTERVENTION:25122}  *** (Goal: ***) -{US controlled/uncontrolled:25276} -Current treatment   -Medications previously tried: ***  -{CCMPHARMDINTERVENTION:25122}  Patient Goals/Self-Care Activities Patient will:  - {pharmacypatientgoals:24919}  Follow Up Plan: {CM FOLLOW UP PRAQT:62263}

## 2020-11-15 ENCOUNTER — Telehealth: Payer: Medicare HMO

## 2020-11-24 ENCOUNTER — Telehealth: Payer: Self-pay | Admitting: Pharmacist

## 2020-11-24 NOTE — Progress Notes (Addendum)
    Chronic Care Management Pharmacy Assistant   Name: [place].  MRN: [id number] DOB: 1945/08/24  Reason for Encounter: Adherence Review  Reviewed the patients chart for any medical/health and/or medication changes there were not any at this time.  Follow-Up:Pharmacist Review   Charlann Lange, [place] Pharmacist Assistant [phone]

## 2020-11-25 ENCOUNTER — Other Ambulatory Visit: Payer: Self-pay

## 2020-11-25 DIAGNOSIS — G894 Chronic pain syndrome: Secondary | ICD-10-CM

## 2020-11-25 MED ORDER — HYDROCODONE-ACETAMINOPHEN 5-325 MG PO TABS: 1.0000 | ORAL_TABLET | Freq: Four times a day (QID) | ORAL | 0 refills | Status: DC | PRN

## 2020-11-25 NOTE — Telephone Encounter (Signed)
PDMP reviewed.  Appears patient takes this medication chronically.  Refill given in place of PCP who is out of the office. 

## 2020-12-10 ENCOUNTER — Encounter: Payer: Self-pay | Admitting: *Deleted

## 2021-01-11 ENCOUNTER — Other Ambulatory Visit: Payer: Self-pay | Admitting: *Deleted

## 2021-01-11 DIAGNOSIS — G894 Chronic pain syndrome: Secondary | ICD-10-CM

## 2021-01-11 MED ORDER — HYDROCODONE-ACETAMINOPHEN 5-325 MG PO TABS: 1.0000 | ORAL_TABLET | Freq: Four times a day (QID) | ORAL | 0 refills | Status: DC | PRN

## 2021-01-11 NOTE — Telephone Encounter (Signed)
Received call from patient.   Requested refill on Hydrocodone/APAP.   Ok to refill??  Last office visit 07/13/2020.  Last refill 11/25/2020.

## 2021-01-24 ENCOUNTER — Other Ambulatory Visit: Payer: Self-pay | Admitting: *Deleted

## 2021-01-24 NOTE — Telephone Encounter (Signed)
Received call from patient.   Requested refill on Sildenafil.   Ok to refill?

## 2021-01-25 MED ORDER — SILDENAFIL CITRATE 100 MG PO TABS: 50.0000 mg | ORAL_TABLET | Freq: Every day | ORAL | 2 refills | Status: DC | PRN

## 2021-02-07 ENCOUNTER — Ambulatory Visit: Payer: Medicare HMO | Admitting: Family Medicine

## 2021-02-08 ENCOUNTER — Other Ambulatory Visit: Payer: Self-pay

## 2021-02-08 ENCOUNTER — Encounter: Payer: Self-pay | Admitting: Family Medicine

## 2021-02-08 ENCOUNTER — Ambulatory Visit (INDEPENDENT_AMBULATORY_CARE_PROVIDER_SITE_OTHER): Payer: Medicare HMO | Admitting: Family Medicine

## 2021-02-08 VITALS — BP 118/70 | HR 52 | Ht 68.0 in | Wt 153.8 lb

## 2021-02-08 DIAGNOSIS — M25512 Pain in left shoulder: Secondary | ICD-10-CM | POA: Diagnosis not present

## 2021-02-08 DIAGNOSIS — I4891 Unspecified atrial fibrillation: Secondary | ICD-10-CM

## 2021-02-08 DIAGNOSIS — E119 Type 2 diabetes mellitus without complications: Secondary | ICD-10-CM | POA: Diagnosis not present

## 2021-02-08 DIAGNOSIS — G8929 Other chronic pain: Secondary | ICD-10-CM | POA: Diagnosis not present

## 2021-02-08 DIAGNOSIS — I739 Peripheral vascular disease, unspecified: Secondary | ICD-10-CM | POA: Diagnosis not present

## 2021-02-08 DIAGNOSIS — Z794 Long term (current) use of insulin: Secondary | ICD-10-CM | POA: Diagnosis not present
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
## 2021-02-09 ENCOUNTER — Other Ambulatory Visit: Payer: Self-pay | Admitting: Family Medicine

## 2021-02-09 DIAGNOSIS — I4891 Unspecified atrial fibrillation: Secondary | ICD-10-CM

## 2021-02-09 LAB — COMPLETE METABOLIC PANEL WITH GFR
AG Ratio: 1.9 (calc) (ref 1.0–2.5)
ALT: 13 U/L (ref 9–46)
AST: 18 U/L (ref 10–35)
Albumin: 4.4 g/dL (ref 3.6–5.1)
Alkaline phosphatase (APISO): 48 U/L (ref 35–144)
BUN: 23 mg/dL (ref 7–25)
CO2: 25 mmol/L (ref 20–32)
Calcium: 9.6 mg/dL (ref 8.6–10.3)
Chloride: 102 mmol/L (ref 98–110)
Creat: 1.25 mg/dL (ref 0.70–1.28)
Globulin: 2.3 g/dL (calc) (ref 1.9–3.7)
Glucose, Bld: 137 mg/dL — ABNORMAL HIGH (ref 65–99)
Potassium: 4.3 mmol/L (ref 3.5–5.3)
Sodium: 137 mmol/L (ref 135–146)
Total Bilirubin: 0.7 mg/dL (ref 0.2–1.2)
Total Protein: 6.7 g/dL (ref 6.1–8.1)
eGFR: 60 mL/min/{1.73_m2} (ref >=60)

## 2021-02-09 LAB — LIPID PANEL
Cholesterol: 148 mg/dL (ref <200)
HDL: 64 mg/dL (ref >=40)
LDL Cholesterol (Calc): 70 mg/dL (calc)
Non-HDL Cholesterol (Calc): 84 mg/dL (calc) (ref <130)
Total CHOL/HDL Ratio: 2.3 (calc) (ref <5.0)
Triglycerides: 61 mg/dL (ref <150)

## 2021-02-09 LAB — CBC WITH DIFFERENTIAL/PLATELET
Absolute Monocytes: 869 cells/uL (ref 200–950)
Basophils Absolute: 40 cells/uL (ref 0–200)
Basophils Relative: 0.5 %
Eosinophils Absolute: 269 cells/uL (ref 15–500)
Eosinophils Relative: 3.4 %
HCT: 35.3 % — ABNORMAL LOW (ref 38.5–50.0)
Hemoglobin: 12.3 g/dL — ABNORMAL LOW (ref 13.2–17.1)
Lymphs Abs: 1675 cells/uL (ref 850–3900)
MCH: 32.2 pg (ref 27.0–33.0)
MCHC: 34.8 g/dL (ref 32.0–36.0)
MCV: 92.4 fL (ref 80.0–100.0)
MPV: 10.4 fL (ref 7.5–12.5)
Monocytes Relative: 11 %
Neutro Abs: 5048 cells/uL (ref 1500–7800)
Neutrophils Relative %: 63.9 %
Platelets: 229 10*3/uL (ref 140–400)
RBC: 3.82 10*6/uL — ABNORMAL LOW (ref 4.20–5.80)
RDW: 12.9 % (ref 11.0–15.0)
Total Lymphocyte: 21.2 %
WBC: 7.9 10*3/uL (ref 3.8–10.8)

## 2021-02-09 LAB — MICROALBUMIN, URINE: Microalb, Ur: 2.2 mg/dL

## 2021-02-09 LAB — HEMOGLOBIN A1C
Hgb A1c MFr Bld: 9.3 % of total Hgb — ABNORMAL HIGH (ref <5.7)
Mean Plasma Glucose: 220 mg/dL
eAG (mmol/L): 12.2 mmol/L

## 2021-02-11 ENCOUNTER — Other Ambulatory Visit: Payer: Self-pay | Admitting: *Deleted

## 2021-02-11 DIAGNOSIS — I739 Peripheral vascular disease, unspecified: Secondary | ICD-10-CM

## 2021-02-11 DIAGNOSIS — E119 Type 2 diabetes mellitus without complications: Secondary | ICD-10-CM

## 2021-02-11 DIAGNOSIS — Z794 Long term (current) use of insulin: Secondary | ICD-10-CM

## 2021-02-17 ENCOUNTER — Telehealth: Payer: Self-pay | Admitting: Pharmacist

## 2021-02-17 NOTE — Progress Notes (Signed)
  Chronic Care Management Pharmacy Assistant   Name: Zachary Qazi Jr.  MRN: [id number] DOB: 03/10/1946  Reason for Encounter: Disease State For DM   Conditions to be addressed/monitored: Hypertension, Afib, Type II DM, hyperlipidemia, thyroid disease.  Recent office visits:  02/08/21 Dr. Pickard For follow-up. No medication changes.  Recent consult visits:  None since 02/08/21  Hospital visits:  None since 02/08/21  Medications: Outpatient Encounter Medications as of 02/17/2021  Medication Sig   Accu-Chek FastClix Lancets MISC USE AS DIRECTED   aspirin 325 MG tablet Take 325 mg by mouth daily.   Blood Glucose Calibration (ACCU-CHEK AVIVA) SOLN Use as directed to calibrate glucometer   Blood Glucose Monitoring Suppl (ACCU-CHEK AVIVA PLUS) w/Device KIT CHECK BLOOD SUGAR FOUR TIMES DAILY DX: E11.9   Cholecalciferol (VITAMIN D3) 250 MCG (10000 UT) capsule Take 10,000 Units by mouth daily.   diltiazem (CARDIZEM CD) 120 MG 24 hr capsule TAKE 1 CAPSULE (120 MG TOTAL) BY MOUTH DAILY.   glucagon (GLUCAGON EMERGENCY) 1 MG injection Inject 1 mg into the vein once as needed for up to 1 dose.   glucose blood (ACCU-CHEK AVIVA PLUS) test strip TEST BLOOD SUGAR FOUR TIMES DAILY (BEFORE MEALS AND AT BEDTIME)   glucose blood test strip 1 each by Other route as needed for other. Use as instructed   HYDROcodone-acetaminophen (NORCO/VICODIN) 5-325 MG tablet Take 1 tablet by mouth every 6 (six) hours as needed for moderate pain.   insulin aspart (NOVOLOG) 100 UNIT/ML injection INJECT 4 UNITS SUBCUTANEOUSLY THREE TIMES DAILY  WITH  EACH MEAL, DISCARD VIAL 28 DAYS AFTER OPENING (Patient taking differently: INJECT 5 UNITS SUBCUTANEOUSLY THREE TIMES DAILY  WITH  EACH MEAL, DISCARD VIAL 28 DAYS AFTER OPENING)   insulin glargine (LANTUS) 100 UNIT/ML injection Inject 0.12 mLs (12 Units total) into the skin at bedtime. (Patient taking differently: Inject 15 Units into the skin at bedtime.)   Lancets Misc. (ACCU-CHEK  FASTCLIX LANCET) KIT Use to check blood sugar   levothyroxine (SYNTHROID) 100 MCG tablet TAKE 1 TABLET EVERY DAY   losartan (COZAAR) 50 MG tablet Take 1 tablet (50 mg total) by mouth daily.   pioglitazone (ACTOS) 30 MG tablet Take 1 tablet (30 mg total) by mouth daily.   sildenafil (REVATIO) 20 MG tablet TAKE 1 TABLET BY MOUTH 30 MINUTES BEFORE SEXUAL INTERCOURSE AS DIRECTED   sildenafil (VIAGRA) 100 MG tablet Take 0.5-1 tablets (50-100 mg total) by mouth daily as needed for erectile dysfunction.   silver sulfADIAZINE (SILVADENE) 1 % cream Apply 1 application topically daily.   simvastatin (ZOCOR) 10 MG tablet TAKE 1 TABLET EVERY DAY  AT  6:00PM   No facility-administered encounter medications on file as of 02/17/2021.   Recent Relevant Labs: Lab Results  Component Value Date/Time   HGBA1C 9.3 (H) 02/08/2021 02:54 PM   HGBA1C 8.1 (H) 08/24/2020 02:22 PM   HGBA1C 7.8 (H) 11/23/2016 03:52 PM   HGBA1C 7.3 (H) 02/24/2016 03:54 PM   MICROALBUR 2.2 02/08/2021 02:54 PM   MICROALBUR 1.4 08/24/2020 02:22 PM    Kidney Function Lab Results  Component Value Date/Time   CREATININE 1.25 02/08/2021 02:54 PM   CREATININE 1.31 (H) 08/24/2020 02:22 PM   GFRNONAA 53 (L) 08/24/2020 02:22 PM   GFRAA 61 08/24/2020 02:22 PM    Current antihyperglycemic regimen:  Actos 30 mg 1 tablet daily Novolog INJECT 5 UNITS SUBCUTANEOUSLY THREE TIMES DAILY  WITH  EACH MEAL, DISCARD VIAL 28 DAYS AFTER OPENING Lantus Inject 15 Units into the   skin at bedtime.  What recent interventions/DTPs have been made to improve glycemic control:  None.   Have there been any recent hospitalizations or ED visits since last visit with CPP? Patients chart does not show any hospitalizations or ER visits.   Adherence Review: Is the patient currently on a STATIN medication? Simvastatin 10 mg  Is the patient currently on ACE/ARB medication? Losartan 50 mg  Does the patient have >5 day gap between last estimated fill dates? Per  misc rpts, yes.   Care Gaps: Patient is due for his AWV.  Star Rating Drugs:Simvastatin 10 mg 12/03/20 90 DS, Pioglitazone 30 mg 10/17/20 90 DS, Losartan 50 mg 11/16/20 90 DS.   Third unsuccessful telephone outreach was attempted today. The patient was referred to the pharmacist for assistance with care management and care coordination.   Follow-Up:Pharmacist Review  Charlann Lange, [place] Pharmacist Assistant [phone]

## 2021-02-18 ENCOUNTER — Telehealth: Payer: Self-pay | Admitting: *Deleted

## 2021-02-18 NOTE — Telephone Encounter (Signed)
Received call from patient.   Reports that he has referral for Endocrinology. States that referral was sent to [place], but he lives in [place]. Requested referral to be placed to specialist nearer to him.   Please call patient to discuss.

## 2021-02-28 DIAGNOSIS — M7542 Impingement syndrome of left shoulder: Secondary | ICD-10-CM | POA: Diagnosis not present

## 2021-03-07 ENCOUNTER — Other Ambulatory Visit: Payer: Self-pay | Admitting: *Deleted

## 2021-03-07 DIAGNOSIS — G894 Chronic pain syndrome: Secondary | ICD-10-CM

## 2021-03-07 NOTE — Telephone Encounter (Signed)
Received call from patient.   Requested refill on hydrocodone/APAP.   Ok to refill??  Last office visit 02/08/2021.  Last refill 01/11/2021.

## 2021-03-08 ENCOUNTER — Encounter: Payer: Self-pay | Admitting: "Endocrinology

## 2021-03-08 ENCOUNTER — Other Ambulatory Visit: Payer: Self-pay

## 2021-03-08 ENCOUNTER — Ambulatory Visit (INDEPENDENT_AMBULATORY_CARE_PROVIDER_SITE_OTHER): Payer: Medicare HMO | Admitting: "Endocrinology

## 2021-03-08 VITALS — BP 128/62 | HR 64 | Ht 68.0 in | Wt 155.8 lb

## 2021-03-08 DIAGNOSIS — E039 Hypothyroidism, unspecified: Secondary | ICD-10-CM

## 2021-03-08 DIAGNOSIS — I10 Essential (primary) hypertension: Secondary | ICD-10-CM

## 2021-03-08 DIAGNOSIS — E1165 Type 2 diabetes mellitus with hyperglycemia: Secondary | ICD-10-CM

## 2021-03-08 DIAGNOSIS — E782 Mixed hyperlipidemia: Secondary | ICD-10-CM

## 2021-03-08 MED ORDER — FREESTYLE LIBRE 2 READER DEVI: 0 refills | Status: DC

## 2021-03-08 MED ORDER — FREESTYLE LIBRE 2 SENSOR MISC: 1.0000 | 3 refills | Status: DC

## 2021-03-08 MED ORDER — HYDROCODONE-ACETAMINOPHEN 5-325 MG PO TABS: 1.0000 | ORAL_TABLET | Freq: Four times a day (QID) | ORAL | 0 refills | Status: DC | PRN
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
## 2021-03-08 NOTE — Patient Instructions (Signed)

## 2021-03-21 ENCOUNTER — Encounter: Payer: Self-pay | Admitting: "Endocrinology

## 2021-03-21 ENCOUNTER — Ambulatory Visit: Payer: Medicare HMO | Admitting: "Endocrinology

## 2021-03-21 VITALS — BP 112/68 | HR 60 | Ht 68.0 in

## 2021-03-21 DIAGNOSIS — E1165 Type 2 diabetes mellitus with hyperglycemia: Secondary | ICD-10-CM

## 2021-03-21 DIAGNOSIS — I10 Essential (primary) hypertension: Secondary | ICD-10-CM

## 2021-03-21 DIAGNOSIS — E039 Hypothyroidism, unspecified: Secondary | ICD-10-CM | POA: Diagnosis not present

## 2021-03-21 DIAGNOSIS — E782 Mixed hyperlipidemia: Secondary | ICD-10-CM

## 2021-03-21 MED ORDER — GLIPIZIDE ER 2.5 MG PO TB24: 5.0000 mg | ORAL_TABLET | Freq: Every day | ORAL | 1 refills | Status: DC
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
## 2021-03-21 NOTE — Patient Instructions (Signed)

## 2021-03-30 ENCOUNTER — Telehealth: Payer: Self-pay | Admitting: Pharmacist

## 2021-03-30 NOTE — Progress Notes (Signed)
Chronic Care Management Pharmacy Assistant   Name: Zachary Reed.  MRN: [id number] DOB: Feb 23, 1946  Reason for Encounter: Disease State For DM   Conditions to be addressed/monitored: Hypertension, Afib, Type II DM, hyperlipidemia, thyroid disease.  Recent office visits:  None since 02/17/21   Recent consult visits:  03/21/21 Abbe Amsterdam, MD. For follow-up. STARTED Glipizide 5 mg daily STOPPED Insulin Aspart.  03/08/21 Abbe Amsterdam, MD. For follow-up. CHANGED Sildenafil to  50-100 mg daily PRN, Insulin Glargine to 20 units at bedtime, Insulin Aspart to 6-12 units 3 times daily(Patient taking differently:  INJECT 5 UNITS SUBCUTANEOUSLY THREE TIMES DAILY  WITH  EACH MEAL, DISCARD VIAL 28 DAYS AFTER OPENING) STOPPED Pioglitazone   Hospital visits:  None since 02/17/21   Medications: Outpatient Encounter Medications as of 03/30/2021  Medication Sig   Accu-Chek FastClix Lancets MISC USE AS DIRECTED   aspirin 325 MG tablet Take 325 mg by mouth daily.   Blood Glucose Calibration (ACCU-CHEK AVIVA) SOLN Use as directed to calibrate glucometer   Blood Glucose Monitoring Suppl (ACCU-CHEK AVIVA PLUS) w/Device KIT CHECK BLOOD SUGAR FOUR TIMES DAILY DX: E11.9   Continuous Blood Gluc Receiver (FREESTYLE LIBRE 2 READER) DEVI As directed   Continuous Blood Gluc Sensor (FREESTYLE LIBRE 2 SENSOR) MISC 1 Piece by Does not apply route every 14 (fourteen) days.   diltiazem (CARDIZEM CD) 120 MG 24 hr capsule TAKE 1 CAPSULE (120 MG TOTAL) BY MOUTH DAILY.   glipiZIDE (GLUCOTROL XL) 2.5 MG 24 hr tablet Take 2 tablets (5 mg total) by mouth daily with breakfast.   glucagon (GLUCAGON EMERGENCY) 1 MG injection Inject 1 mg into the vein once as needed for up to 1 dose.   glucose blood (ACCU-CHEK AVIVA PLUS) test strip TEST BLOOD SUGAR FOUR TIMES DAILY (BEFORE MEALS AND AT BEDTIME)   glucose blood test strip 1 each by Other route as needed for other. Use as instructed    HYDROcodone-acetaminophen (NORCO/VICODIN) 5-325 MG tablet Take 1 tablet by mouth every 6 (six) hours as needed for moderate pain.   insulin glargine (LANTUS) 100 UNIT/ML Solostar Pen Inject 20 Units into the skin at bedtime.   Lancets Misc. (ACCU-CHEK FASTCLIX LANCET) KIT Use to check blood sugar   levothyroxine (SYNTHROID) 100 MCG tablet TAKE 1 TABLET EVERY DAY   losartan (COZAAR) 50 MG tablet Take 1 tablet (50 mg total) by mouth daily.   sildenafil (VIAGRA) 100 MG tablet Take 0.5-1 tablets (50-100 mg total) by mouth daily as needed for erectile dysfunction.   silver sulfADIAZINE (SILVADENE) 1 % cream Apply 1 application topically daily.   simvastatin (ZOCOR) 10 MG tablet TAKE 1 TABLET EVERY DAY  AT  6:00PM   VITAMIN D-VITAMIN K PO Take by mouth daily in the afternoon.   zinc gluconate 50 MG tablet Take 50 mg by mouth daily.   No facility-administered encounter medications on file as of 03/30/2021.   Recent Relevant Labs: Lab Results  Component Value Date/Time   HGBA1C 9.3 (H) 02/08/2021 02:54 PM   HGBA1C 8.1 (H) 08/24/2020 02:22 PM   HGBA1C 7.8 (H) 11/23/2016 03:52 PM   HGBA1C 7.3 (H) 02/24/2016 03:54 PM   MICROALBUR 2.2 02/08/2021 02:54 PM   MICROALBUR 1.4 08/24/2020 02:22 PM    Kidney Function Lab Results  Component Value Date/Time   CREATININE 1.25 02/08/2021 02:54 PM   CREATININE 1.31 (H) 08/24/2020 02:22 PM   GFRNONAA 53 (L) 08/24/2020 02:22 PM   GFRAA 61 08/24/2020 02:22 PM  Current antihyperglycemic regimen:  Insulin Glargine 100 unit/ml 20 units at bedtime Glipizide 5 mg daily   What recent interventions/DTPs have been made to improve glycemic control:  None.   Have there been any recent hospitalizations or ED visits since last visit with CPP? Patient stated no.   Patient reports hypoglycemic symptoms, including Pale, Sweaty, Shaky, and Nervous/irritable  Patient reports hyperglycemic symptoms, including polyuria  How often are you checking your blood sugar?  Patient stated 3-4 times daily  What are your blood sugars ranging?  Patient stated his sugars are all over the place. He stated sometimes they are really low and sometimes they are really high.  During the week, how often does your blood glucose drop below 70?  Patient stated it does sometimes weekly or twice a week.  Are you checking your feet daily/regularly?  Patient reminded to check his feet regularly.   Adherence Review: Is the patient currently on a STATIN medication? Simvastatin 10 mg  Is the patient currently on ACE/ARB medication? Losartan 50 mg  Does the patient have >5 day gap between last estimated fill dates? Per misc rpts, yes.  Care Gaps:Patient is due for his AWV. Scheduled for 11/17 at 330 pm.   Star Rating Drugs:Glipizide 2.5 mg, Simvastatin 10 mg 02/10/21 90 DS, Losartan 50 mg 11/16/20 90 DS.  Patient would like a food plan on what he can eat and what he cant eat so he can improve his blood sugar readings.  Follow-Up:Pharmacist Review  Charlann Lange, [place] Pharmacist Assistant [phone]

## 2021-04-12 ENCOUNTER — Other Ambulatory Visit: Payer: Self-pay | Admitting: *Deleted

## 2021-04-12 MED ORDER — SIMVASTATIN 10 MG PO TABS: 10.0000 mg | ORAL_TABLET | Freq: Every day | ORAL | 3 refills | Status: DC

## 2021-04-12 MED ORDER — ACCU-CHEK AVIVA PLUS W/DEVICE KIT: PACK | 0 refills | Status: DC

## 2021-04-12 MED ORDER — ACCU-CHEK AVIVA PLUS VI STRP: ORAL_STRIP | 1 refills | Status: DC

## 2021-04-14 ENCOUNTER — Other Ambulatory Visit: Payer: Self-pay

## 2021-04-14 ENCOUNTER — Other Ambulatory Visit: Payer: Self-pay | Admitting: *Deleted

## 2021-04-14 ENCOUNTER — Ambulatory Visit (INDEPENDENT_AMBULATORY_CARE_PROVIDER_SITE_OTHER): Payer: Medicare HMO

## 2021-04-14 VITALS — Ht 68.0 in | Wt 155.0 lb

## 2021-04-14 DIAGNOSIS — Z0000 Encounter for general adult medical examination without abnormal findings: Secondary | ICD-10-CM | POA: Diagnosis not present

## 2021-04-14 DIAGNOSIS — G894 Chronic pain syndrome: Secondary | ICD-10-CM

## 2021-04-14 MED ORDER — HYDROCODONE-ACETAMINOPHEN 5-325 MG PO TABS: 1.0000 | ORAL_TABLET | Freq: Four times a day (QID) | ORAL | 0 refills | Status: DC | PRN

## 2021-04-14 NOTE — Telephone Encounter (Signed)
Received call from patient.   Requested refill on Hydrocodone/APAP.   Ok to refill??  Last office visit 02/08/2021.  Last refill 03/08/2021.

## 2021-04-14 NOTE — Patient Instructions (Signed)
Mr. Zachary Reed , Thank you for taking time to come for your Medicare Wellness Visit. I appreciate your ongoing commitment to your health goals. Please review the following plan we discussed and let me know if I can assist you in the future.   Screening recommendations/referrals: Colonoscopy: Done 05/29/2002 Repeat in 10 years  Please call after first of year to schedule.  Recommended yearly ophthalmology/optometry visit for glaucoma screening and checkup Recommended yearly dental visit for hygiene and checkup  Vaccinations: Influenza vaccine: Declined. Pneumococcal vaccine: Done 12/19/2000 and 08/11/2011 Tdap vaccine: Done 08/11/2011 Repeat in 10 years  Shingles vaccine: Declined.    Covid-19: Declined.  Advanced directives: .ahadv  Conditions/risks identified: Please bring a copy of your health care power of attorney and living will to the office to be added to your chart at your convenience.   Next appointment: Follow up in one year for your annual wellness visit. 2023.  Preventive Care 75 Years and Older, Male  Preventive care refers to lifestyle choices and visits with your health care provider that can promote health and wellness. What does preventive care include? A yearly physical exam. This is also called an annual well check. Dental exams once or twice a year. Routine eye exams. Ask your health care provider how often you should have your eyes checked. Personal lifestyle choices, including: Daily care of your teeth and gums. Regular physical activity. Eating a healthy diet. Avoiding tobacco and drug use. Limiting alcohol use. Practicing safe sex. Taking low doses of aspirin every day. Taking vitamin and mineral supplements as recommended by your health care provider. What happens during an annual well check? The services and screenings done by your health care provider during your annual well check will depend on your age, overall health, lifestyle risk factors, and family  history of disease. Counseling  Your health care provider may ask you questions about your: Alcohol use. Tobacco use. Drug use. Emotional well-being. Home and relationship well-being. Sexual activity. Eating habits. History of falls. Memory and ability to understand (cognition). Work and work Statistician. Screening  You may have the following tests or measurements: Height, weight, and BMI. Blood pressure. Lipid and cholesterol levels. These may be checked every 5 years, or more frequently if you are over 69 years old. Skin check. Lung cancer screening. You may have this screening every year starting at age 68 if you have a 30-pack-year history of smoking and currently smoke or have quit within the past 15 years. Fecal occult blood test (FOBT) of the stool. You may have this test every year starting at age 31. Flexible sigmoidoscopy or colonoscopy. You may have a sigmoidoscopy every 5 years or a colonoscopy every 10 years starting at age 56. Prostate cancer screening. Recommendations will vary depending on your family history and other risks. Hepatitis C blood test. Hepatitis B blood test. Sexually transmitted disease (STD) testing. Diabetes screening. This is done by checking your blood sugar (glucose) after you have not eaten for a while (fasting). You may have this done every 1-3 years. Abdominal aortic aneurysm (AAA) screening. You may need this if you are a current or former smoker. Osteoporosis. You may be screened starting at age 78 if you are at high risk. Talk with your health care provider about your test results, treatment options, and if necessary, the need for more tests. Vaccines  Your health care provider may recommend certain vaccines, such as: Influenza vaccine. This is recommended every year. Tetanus, diphtheria, and acellular pertussis (Tdap, Td) vaccine. You  may need a Td booster every 10 years. Zoster vaccine. You may need this after age 8. Pneumococcal  13-valent conjugate (PCV13) vaccine. One dose is recommended after age 31. Pneumococcal polysaccharide (PPSV23) vaccine. One dose is recommended after age 23. Talk to your health care provider about which screenings and vaccines you need and how often you need them. This information is not intended to replace advice given to you by your health care provider. Make sure you discuss any questions you have with your health care provider. Document Released: 06/11/2015 Document Revised: 02/02/2016 Document Reviewed: 03/16/2015 Elsevier Interactive Patient Education  2017 [employer] Prevention in the Home Falls can cause injuries. They can happen to people of all ages. There are many things you can do to make your home safe and to help prevent falls. What can I do on the outside of my home? Regularly fix the edges of walkways and driveways and fix any cracks. Remove anything that might make you trip as you walk through a door, such as a raised step or threshold. Trim any bushes or trees on the path to your home. Use bright outdoor lighting. Clear any walking paths of anything that might make someone trip, such as rocks or tools. Regularly check to see if handrails are loose or broken. Make sure that both sides of any steps have handrails. Any raised decks and porches should have guardrails on the edges. Have any leaves, snow, or ice cleared regularly. Use sand or salt on walking paths during winter. Clean up any spills in your garage right away. This includes oil or grease spills. What can I do in the bathroom? Use night lights. Install grab bars by the toilet and in the tub and shower. Do not use towel bars as grab bars. Use non-skid mats or decals in the tub or shower. If you need to sit down in the shower, use a plastic, non-slip stool. Keep the floor dry. Clean up any water that spills on the floor as soon as it happens. Remove soap buildup in the tub or shower regularly. Attach  bath mats securely with double-sided non-slip rug tape. Do not have throw rugs and other things on the floor that can make you trip. What can I do in the bedroom? Use night lights. Make sure that you have a light by your bed that is easy to reach. Do not use any sheets or blankets that are too big for your bed. They should not hang down onto the floor. Have a firm chair that has side arms. You can use this for support while you get dressed. Do not have throw rugs and other things on the floor that can make you trip. What can I do in the kitchen? Clean up any spills right away. Avoid walking on wet floors. Keep items that you use a lot in easy-to-reach places. If you need to reach something above you, use a strong step stool that has a grab bar. Keep electrical cords out of the way. Do not use floor polish or wax that makes floors slippery. If you must use wax, use non-skid floor wax. Do not have throw rugs and other things on the floor that can make you trip. What can I do with my stairs? Do not leave any items on the stairs. Make sure that there are handrails on both sides of the stairs and use them. Fix handrails that are broken or loose. Make sure that handrails are as long as the  stairways. Check any carpeting to make sure that it is firmly attached to the stairs. Fix any carpet that is loose or worn. Avoid having throw rugs at the top or bottom of the stairs. If you do have throw rugs, attach them to the floor with carpet tape. Make sure that you have a light switch at the top of the stairs and the bottom of the stairs. If you do not have them, ask someone to add them for you. What else can I do to help prevent falls? Wear shoes that: Do not have high heels. Have rubber bottoms. Are comfortable and fit you well. Are closed at the toe. Do not wear sandals. If you use a stepladder: Make sure that it is fully opened. Do not climb a closed stepladder. Make sure that both sides of the  stepladder are locked into place. Ask someone to hold it for you, if possible. Clearly mark and make sure that you can see: Any grab bars or handrails. First and last steps. Where the edge of each step is. Use tools that help you move around (mobility aids) if they are needed. These include: Canes. Walkers. Scooters. Crutches. Turn on the lights when you go into a dark area. Replace any light bulbs as soon as they burn out. Set up your furniture so you have a clear path. Avoid moving your furniture around. If any of your floors are uneven, fix them. If there are any pets around you, be aware of where they are. Review your medicines with your doctor. Some medicines can make you feel dizzy. This can increase your chance of falling. Ask your doctor what other things that you can do to help prevent falls. This information is not intended to replace advice given to you by your health care provider. Make sure you discuss any questions you have with your health care provider. Document Released: 03/11/2009 Document Revised: 10/21/2015 Document Reviewed: 06/19/2014 Elsevier Interactive Patient Education  2017 [employer].
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
## 2021-05-06 ENCOUNTER — Other Ambulatory Visit: Payer: Self-pay | Admitting: Family Medicine

## 2021-05-10 ENCOUNTER — Telehealth: Payer: Self-pay | Admitting: Pharmacist

## 2021-05-10 NOTE — Progress Notes (Signed)
Chronic Care Management Pharmacy Assistant   Name: Zachary Reed.  MRN: [id number] DOB: 11/19/1945   Reason for Encounter: Disease State - Diabetes Call    Recent office visits:  04/14/21 Annual Medicare Wellness Visit done.   Recent consult visits:  None noted.   Hospital visits:  None in previous 6 months  Medications: Outpatient Encounter Medications as of 05/10/2021  Medication Sig   Accu-Chek FastClix Lancets MISC USE AS DIRECTED   aspirin 325 MG tablet Take 325 mg by mouth daily.   Blood Glucose Calibration (ACCU-CHEK AVIVA) SOLN Use as directed to calibrate glucometer   Blood Glucose Monitoring Suppl (ACCU-CHEK AVIVA PLUS) w/Device KIT CHECK BLOOD SUGAR FOUR TIMES DAILY DX: E11.9   Continuous Blood Gluc Receiver (FREESTYLE LIBRE 2 READER) DEVI As directed   Continuous Blood Gluc Sensor (FREESTYLE LIBRE 2 SENSOR) MISC 1 Piece by Does not apply route every 14 (fourteen) days.   diltiazem (TIAZAC) 120 MG 24 hr capsule diltiazem CD 120 mg capsule,extended release 24 hr   glipiZIDE (GLUCOTROL XL) 2.5 MG 24 hr tablet Take 2 tablets (5 mg total) by mouth daily with breakfast.   glucagon (GLUCAGON EMERGENCY) 1 MG injection Inject 1 mg into the vein once as needed for up to 1 dose.   glucose blood (ACCU-CHEK AVIVA PLUS) test strip CHECK BLOOD SUGAR FOUR TIMES DAILY DX: E11.9   HYDROcodone-acetaminophen (NORCO/VICODIN) 5-325 MG tablet Take 1 tablet by mouth every 6 (six) hours as needed for moderate pain.   insulin glargine (LANTUS) 100 UNIT/ML Solostar Pen Inject 20 Units into the skin at bedtime.   Lancets Misc. (ACCU-CHEK FASTCLIX LANCET) KIT Use to check blood sugar   levothyroxine (SYNTHROID) 100 MCG tablet TAKE 1 TABLET EVERY DAY   lisinopril (ZESTRIL) 10 MG tablet lisinopril 10 mg tablet   losartan (COZAAR) 50 MG tablet TAKE 1 TABLET EVERY DAY   sildenafil (VIAGRA) 100 MG tablet Take 0.5-1 tablets (50-100 mg total) by mouth daily as needed for erectile dysfunction.    silver sulfADIAZINE (SILVADENE) 1 % cream Apply 1 application topically daily. (Patient not taking: Reported on 04/14/2021)   VITAMIN D-VITAMIN K PO Take by mouth daily in the afternoon.   zinc gluconate 50 MG tablet Take 50 mg by mouth daily.   No facility-administered encounter medications on file as of 05/10/2021.    Current antihyperglycemic regimen:  Insulin Glargine 100 unit/ml 20 units at bedtime Glipizide 5 mg daily    What recent interventions/DTPs have been made to improve glycemic control:  Patient reported   Have there been any recent hospitalizations or ED visits since last visit with CPP?  Patient has not had any ED visits or hospitalizations since last visit with PCP.  Patient  hypoglycemic symptoms, including    Patient hyperglycemic symptoms, including    How often are you checking your blood sugar? Patient reported checking blood sugar   What are your blood sugars ranging?  Fasting:  Before meals:  After meals:  Bedtime:   During the week, how often does your blood glucose drop below 70?  Patient reported   Are you checking your feet daily/regularly? Patient reported     Adherence Review: Is the patient currently on a STATIN medication? Yes Is the patient currently on ACE/ARB medication? Yes Does the patient have >5 day gap between last estimated fill dates? No (pt denies any gaps in fill dates)  Insulin Glargine 100 unit/ml 20 units at bedtime - last filled 07/17/20 24 days  Glipizide  5 mg daily - last filled -03/21/21 90 days    Care Gaps  AWV: done 04/14/21 Colonoscopy: done 05/29/2002 DM Eye Exam:  overdue 08/27/15 DM Foot Exam: due 02/08/22 Microalbumin: done 02/08/21 HbgAIC: done 02/08/21 (9.3) DEXA: N/A Mammogram: N/A   Star Rating Drugs: Glipizide 2.5 mg - last filled 03/21/21 90 days  Simvastatin 10 mg - last filled 02/10/21 90 days  Losartan 50 mg - last filled 02/24/21 90 days   Future Appointments  Date Time Provider [place]  05/13/2021  2:30 PM Susy Frizzle, MD [facility] None  05/31/2021  3:30 PM Cassandria Anger, MD [facility] None  04/20/2022  3:30 PM [facility]-NURSE HEALTH ADVISOR [facility] None   Multiple attempts were made to contact patient. Attempts were unsuccessful. / ls,CMA  Jobe Gibbon, [place] Pharmacist Assistant  [phone]

## 2021-05-13 ENCOUNTER — Other Ambulatory Visit: Payer: Self-pay

## 2021-05-13 ENCOUNTER — Ambulatory Visit (INDEPENDENT_AMBULATORY_CARE_PROVIDER_SITE_OTHER): Payer: Medicare HMO | Admitting: Family Medicine

## 2021-05-13 ENCOUNTER — Encounter: Payer: Self-pay | Admitting: Family Medicine

## 2021-05-13 VITALS — BP 108/82 | HR 32 | Resp 18 | Ht 68.0 in | Wt 155.0 lb

## 2021-05-13 DIAGNOSIS — S39012A Strain of muscle, fascia and tendon of lower back, initial encounter: Secondary | ICD-10-CM | POA: Diagnosis not present

## 2021-05-13 MED ORDER — CYCLOBENZAPRINE HCL 10 MG PO TABS: 10.0000 mg | ORAL_TABLET | Freq: Three times a day (TID) | ORAL | 0 refills | Status: DC | PRN
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
## 2021-05-20 ENCOUNTER — Telehealth: Payer: Self-pay

## 2021-05-20 ENCOUNTER — Ambulatory Visit: Payer: Medicare HMO | Admitting: Family Medicine

## 2021-05-20 NOTE — Telephone Encounter (Signed)
Pt called in requesting a refill of HYDROcodone-acetaminophen (NORCO/VICODIN) 5-325 MG sent to [facility]  Cb#: [phone]

## 2021-05-20 NOTE — Telephone Encounter (Signed)
LOV 05/13/21 Last refill 04/14/21, #90, 0 refills  Please review, thanks!

## 2021-05-24 ENCOUNTER — Other Ambulatory Visit: Payer: Self-pay | Admitting: Family Medicine

## 2021-05-24 DIAGNOSIS — G894 Chronic pain syndrome: Secondary | ICD-10-CM

## 2021-05-24 MED ORDER — HYDROCODONE-ACETAMINOPHEN 5-325 MG PO TABS: 1.0000 | ORAL_TABLET | Freq: Four times a day (QID) | ORAL | 0 refills | Status: DC | PRN

## 2021-05-26 ENCOUNTER — Encounter: Payer: Self-pay | Admitting: Family Medicine

## 2021-05-26 ENCOUNTER — Other Ambulatory Visit: Payer: Self-pay

## 2021-05-26 ENCOUNTER — Ambulatory Visit (INDEPENDENT_AMBULATORY_CARE_PROVIDER_SITE_OTHER): Payer: Medicare HMO | Admitting: Family Medicine

## 2021-05-26 VITALS — BP 132/82 | HR 67 | Ht 68.0 in | Wt 155.0 lb

## 2021-05-26 DIAGNOSIS — M545 Low back pain, unspecified: Secondary | ICD-10-CM
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
## 2021-05-27 ENCOUNTER — Telehealth: Payer: Self-pay

## 2021-05-27 NOTE — Telephone Encounter (Signed)
Pt came in today to have ppw completed for FMLA. Pt completed an in take form. Intake form and FMLA ppw place in nurse's folder. Pt would like a call when ppw is available for pick up.   Cb#: [phone]

## 2021-05-31 ENCOUNTER — Ambulatory Visit: Payer: Medicare HMO | Admitting: "Endocrinology

## 2021-05-31 NOTE — Telephone Encounter (Signed)
Form given to provider to complete

## 2021-06-01 ENCOUNTER — Telehealth: Payer: Self-pay

## 2021-06-01 NOTE — Telephone Encounter (Signed)
Pt called in to inquire about his FMLA ppw being sent off. Pt states that he isn't sure how he would start PT when he has not had an xray done? Please advise.  Cb#: [phone]

## 2021-06-02 ENCOUNTER — Telehealth: Payer: Self-pay | Admitting: Family Medicine

## 2021-06-02 NOTE — Telephone Encounter (Signed)
Patient picked up forms signed by provider yesterday; still has questions before forms sent in.  Please advise at [phone].

## 2021-06-02 NOTE — Telephone Encounter (Signed)
Informed patient paperwork can take 7-10 business days and it will be sent to company when complete.

## 2021-06-03 NOTE — Telephone Encounter (Signed)
Called patient to inquire about questions before sending in his paperwork. Patient stated he had sent the papers in but he wanted to know if he needed an xray?

## 2021-06-06 ENCOUNTER — Ambulatory Visit
Admission: RE | Admit: 2021-06-06 | Discharge: 2021-06-06 | Disposition: A | Discharge status: Home / Self Care | Payer: Medicare HMO | Attending: Family Medicine | Admitting: Family Medicine

## 2021-06-06 ENCOUNTER — Ambulatory Visit
Admission: RE | Admit: 2021-06-06 | Discharge: 2021-06-06 | Disposition: A | Discharge status: Home / Self Care | Payer: Medicare HMO | Source: Ambulatory Visit | Attending: Family Medicine | Admitting: Family Medicine

## 2021-06-06 DIAGNOSIS — M545 Low back pain, unspecified: Secondary | ICD-10-CM

## 2021-06-06 NOTE — Telephone Encounter (Signed)
Spoke with pt and advised he does need to have xray completed. Pt aware imaging to be done at [facility]. Pt advised we will contact him once report received and reviewed.

## 2021-06-07 ENCOUNTER — Telehealth: Payer: Self-pay

## 2021-06-07 NOTE — Telephone Encounter (Signed)
-----   Message from Susy Frizzle, MD sent at 06/07/2021  6:44 AM EST ----- He has a compression fracture at L2 likely causing his pain.  Recommend referral to neurosurgery to see if he is a candidate for kyphoplasty given his pain.

## 2021-06-07 NOTE — Telephone Encounter (Signed)
Left message for patient to call back regarding results and recommendations. °

## 2021-06-10 ENCOUNTER — Other Ambulatory Visit: Payer: Self-pay

## 2021-06-10 ENCOUNTER — Other Ambulatory Visit: Payer: Self-pay | Admitting: Family Medicine

## 2021-06-10 DIAGNOSIS — S32020A Wedge compression fracture of second lumbar vertebra, initial encounter for closed fracture: Secondary | ICD-10-CM

## 2021-06-10 NOTE — Progress Notes (Signed)
Pt advised of xray results and recommendations. Referral to NeuroSurg placed. Pt aware someone will contact him to schedule.

## 2021-06-10 NOTE — Telephone Encounter (Signed)
Pt aware paperwork has been completed and was faxed to [facility] on 06/07/20. Paperwork updated to reflect new diagnoses, faxed this and xray report to [facility]. Pt aware.

## 2021-06-15 ENCOUNTER — Encounter: Payer: Self-pay | Admitting: Physical Therapy

## 2021-06-15 ENCOUNTER — Ambulatory Visit: Payer: Medicare HMO | Attending: Family Medicine | Admitting: Physical Therapy

## 2021-06-15 DIAGNOSIS — M545 Low back pain, unspecified: Secondary | ICD-10-CM | POA: Insufficient documentation

## 2021-06-15 DIAGNOSIS — G8929 Other chronic pain: Secondary | ICD-10-CM

## 2021-06-15 DIAGNOSIS — M6281 Muscle weakness (generalized): Secondary | ICD-10-CM | POA: Diagnosis not present
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
## 2021-06-17 ENCOUNTER — Ambulatory Visit: Payer: Medicare HMO | Admitting: Physical Therapy

## 2021-06-17 ENCOUNTER — Encounter: Payer: Self-pay | Admitting: Physical Therapy

## 2021-06-17 DIAGNOSIS — M6281 Muscle weakness (generalized): Secondary | ICD-10-CM | POA: Diagnosis not present

## 2021-06-17 DIAGNOSIS — M545 Low back pain, unspecified: Secondary | ICD-10-CM | POA: Diagnosis not present

## 2021-06-17 DIAGNOSIS — G8929 Other chronic pain: Secondary | ICD-10-CM | POA: Diagnosis not present
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
## 2021-06-20 ENCOUNTER — Telehealth: Payer: Self-pay

## 2021-06-20 NOTE — Telephone Encounter (Signed)
Pt came in to drop off ppw that needs to be completed by pcp, and then faxed. Pt filled out an intake form, intake form and ppw placed in nurse's folder. Please advise.  Cb#: [phone]

## 2021-06-21 ENCOUNTER — Other Ambulatory Visit: Payer: Self-pay

## 2021-06-21 ENCOUNTER — Ambulatory Visit: Payer: Medicare HMO | Admitting: Physical Therapy

## 2021-06-21 ENCOUNTER — Encounter: Payer: Self-pay | Admitting: Physical Therapy

## 2021-06-21 DIAGNOSIS — G8929 Other chronic pain: Secondary | ICD-10-CM

## 2021-06-21 DIAGNOSIS — M545 Low back pain, unspecified: Secondary | ICD-10-CM | POA: Diagnosis not present

## 2021-06-21 DIAGNOSIS — M6281 Muscle weakness (generalized): Secondary | ICD-10-CM | POA: Diagnosis not present
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
## 2021-06-22 NOTE — Telephone Encounter (Signed)
Spoke with PT office and they are expecting to treat pt 2xw for 6-8 weeks.   Pt states his current paperwork has him RTW 06/25/21 but he does not feel he is ready to go back to work.  Paperwork completed and placed on provider's desk for signature.

## 2021-06-23 ENCOUNTER — Ambulatory Visit: Payer: Medicare HMO | Admitting: Physical Therapy

## 2021-06-23 ENCOUNTER — Encounter: Payer: Self-pay | Admitting: Physical Therapy

## 2021-06-23 ENCOUNTER — Other Ambulatory Visit: Payer: Self-pay

## 2021-06-23 DIAGNOSIS — M6281 Muscle weakness (generalized): Secondary | ICD-10-CM | POA: Diagnosis not present

## 2021-06-23 DIAGNOSIS — G8929 Other chronic pain: Secondary | ICD-10-CM

## 2021-06-23 DIAGNOSIS — M545 Low back pain, unspecified: Secondary | ICD-10-CM | POA: Diagnosis not present
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
## 2021-06-27 ENCOUNTER — Other Ambulatory Visit: Payer: Self-pay

## 2021-06-27 ENCOUNTER — Encounter: Payer: Self-pay | Admitting: Family Medicine

## 2021-06-27 ENCOUNTER — Ambulatory Visit (INDEPENDENT_AMBULATORY_CARE_PROVIDER_SITE_OTHER): Payer: Medicare HMO | Admitting: Family Medicine

## 2021-06-27 VITALS — BP 128/68 | HR 79 | Temp 97.3°F | Resp 18 | Ht 68.0 in | Wt 155.0 lb

## 2021-06-27 DIAGNOSIS — S32020A Wedge compression fracture of second lumbar vertebra, initial encounter for closed fracture: Secondary | ICD-10-CM | POA: Diagnosis not present
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
## 2021-06-28 ENCOUNTER — Telehealth: Payer: Self-pay

## 2021-06-28 NOTE — Telephone Encounter (Signed)
Pt called again to state he DOES NOT want to complete an MRI at this time. He would like to wait on that procedure. Order canceled.

## 2021-06-28 NOTE — Telephone Encounter (Signed)
Pt called to ask for letter stating he is to remain out of work until MRI is completed or what your recommendations are. Please advise, thanks!

## 2021-06-28 NOTE — Telephone Encounter (Signed)
Letter created with RTW date of 07/04/21 per Dr. Dennard Schaumann.  [facility] to advise letter will be at front desk

## 2021-06-29 ENCOUNTER — Ambulatory Visit: Payer: Medicare HMO | Admitting: Physical Therapy

## 2021-06-29 ENCOUNTER — Other Ambulatory Visit: Payer: Self-pay

## 2021-06-29 DIAGNOSIS — G894 Chronic pain syndrome: Secondary | ICD-10-CM

## 2021-06-29 NOTE — Telephone Encounter (Signed)
LOV 06/27/21 Last refill 05/24/21, #90, 0 refills  Please review, thanks!

## 2021-06-30 MED ORDER — HYDROCODONE-ACETAMINOPHEN 5-325 MG PO TABS: 1.0000 | ORAL_TABLET | Freq: Four times a day (QID) | ORAL | 0 refills | Status: DC | PRN

## 2021-07-01 ENCOUNTER — Ambulatory Visit: Payer: Medicare HMO | Admitting: Physical Therapy

## 2021-07-04 ENCOUNTER — Encounter: Payer: Medicare HMO | Admitting: Physical Therapy

## 2021-07-04 ENCOUNTER — Telehealth: Payer: Self-pay | Admitting: Family Medicine

## 2021-07-04 NOTE — Telephone Encounter (Signed)
Number provided received from patient's paperwork. Patient also picked up letter for returning to work.

## 2021-07-04 NOTE — Telephone Encounter (Signed)
Patient requesting for provider to call [employer] to inform of patient's release to return to work today. They can be reached at [phone].  Please advise patient at [phone].

## 2021-07-04 NOTE — Telephone Encounter (Signed)
All documentation and letter were faxed to [facility] last week, same copy of letter pt has. The number provided is a fax number. Unable to reach anyone at [facility].  [facility] for pt

## 2021-07-06 ENCOUNTER — Encounter: Payer: Medicare HMO | Admitting: Physical Therapy

## 2021-07-12 ENCOUNTER — Encounter: Payer: Medicare HMO | Admitting: Physical Therapy

## 2021-07-12 NOTE — Addendum Note (Signed)
Addended by: Ramonita Lab on: 07/12/2021 04:39 PM   Modules accepted: Orders

## 2021-07-14 ENCOUNTER — Encounter: Payer: Medicare HMO | Admitting: Physical Therapy

## 2021-07-18 ENCOUNTER — Encounter: Payer: Medicare HMO | Admitting: Physical Therapy

## 2021-07-20 ENCOUNTER — Encounter: Payer: Medicare HMO | Admitting: Physical Therapy

## 2021-07-25 ENCOUNTER — Encounter: Payer: Medicare HMO | Admitting: Physical Therapy

## 2021-07-27 ENCOUNTER — Encounter: Payer: Medicare HMO | Admitting: Physical Therapy

## 2021-08-11 ENCOUNTER — Telehealth: Payer: Self-pay | Admitting: Pharmacist

## 2021-08-11 NOTE — Progress Notes (Signed)
? ? ?Chronic Care Management ?Pharmacy Assistant  ? ?Name: Zachary Reed.  MRN: [id number] DOB: 1945-07-08 ? ? ?Reason for Encounter: Disease State - General Adherence Call  ?  ? ?Recent office visits:  ?06/27/20 Jenna Luo, MD - Family Medicine - Compression fracture - MRI ordered. Follow up as scheduled.  ? ?05/26/21 Jenna Luo, MD - Family Medicine - Low back pain - XR of Lumbar spine ordered. Referral placed for Physical Therapy. Follow up as needed.  ? ?05/13/21 Jenna Luo, MD - Family Medicine - Strain of Lumbar region - cyclobenzaprine (FLEXERIL) 10 MG tablet prescribed. Follow up as scheduled. ? ? ?Recent consult visits:  ?06/23/21 Durwin Reges, PT - Chronic low back pain - Rehab medicine - Follow up as scheduled.  ? ?06/21/21 Durwin Reges, PT - Chronic low back pain - Rehab medicine - Follow up as scheduled.  ? ?06/17/21 Durwin Reges, PT - Chronic low back pain - Rehab medicine - Follow up as scheduled.  ? ?06/15/21 Durwin Reges, PT - Chronic low back pain - Rehab medicine - Follow up as scheduled.  ? ?Hospital visits:  ?None in previous 6 months ? ?Medications: ?Outpatient Encounter Medications as of 08/11/2021  ?Medication Sig  ? Accu-Chek FastClix Lancets MISC USE AS DIRECTED  ? aspirin 325 MG tablet Take 325 mg by mouth daily.  ? Blood Glucose Calibration (ACCU-CHEK AVIVA) SOLN Use as directed to calibrate glucometer  ? Blood Glucose Monitoring Suppl (ACCU-CHEK GUIDE) w/Device KIT USE AS DIRECTED  ? Continuous Blood Gluc Receiver (FREESTYLE LIBRE 2 READER) DEVI As directed  ? Continuous Blood Gluc Sensor (FREESTYLE LIBRE 2 SENSOR) MISC 1 Piece by Does not apply route every 14 (fourteen) days.  ? cyclobenzaprine (FLEXERIL) 10 MG tablet Take 1 tablet (10 mg total) by mouth 3 (three) times daily as needed for muscle spasms.  ? diltiazem (CARDIZEM CD) 120 MG 24 hr capsule Take 120 mg by mouth daily.  ? glucagon (GLUCAGON EMERGENCY) 1 MG injection Inject 1 mg into the vein once as needed  for up to 1 dose.  ? glucose blood (ACCU-CHEK AVIVA PLUS) test strip TEST BLOOD SUGAR FOUR TIMES DAILY BEFORE MEALS  AND AT BEDTIME  ? HYDROcodone-acetaminophen (NORCO/VICODIN) 5-325 MG tablet Take 1 tablet by mouth every 6 (six) hours as needed for moderate pain.  ? insulin glargine (LANTUS) 100 UNIT/ML Solostar Pen Inject 20 Units into the skin at bedtime.  ? Lancets Misc. (ACCU-CHEK FASTCLIX LANCET) KIT Use to check blood sugar  ? levothyroxine (SYNTHROID) 100 MCG tablet TAKE 1 TABLET EVERY DAY  ? lisinopril (ZESTRIL) 10 MG tablet lisinopril 10 mg tablet  ? losartan (COZAAR) 50 MG tablet TAKE 1 TABLET EVERY DAY  ? sildenafil (VIAGRA) 100 MG tablet Take 0.5-1 tablets (50-100 mg total) by mouth daily as needed for erectile dysfunction.  ? silver sulfADIAZINE (SILVADENE) 1 % cream Apply 1 application topically daily.  ? simvastatin (ZOCOR) 10 MG tablet Take 10 mg by mouth at bedtime.  ? VITAMIN D-VITAMIN K PO Take by mouth daily in the afternoon.  ? zinc gluconate 50 MG tablet Take 50 mg by mouth daily.  ? ?No facility-administered encounter medications on file as of 08/11/2021.  ? ? ?Have you had any problems recently with your health? ? ? ?Have you had any problems with your pharmacy? ? ? ?What issues or side effects are you having with your medications? ? ? ?What would you like me to pass along to [facility], CPP for them to help you  with?  ? ? ?What can we do to take care of you better? ? ? ?Care Gaps ?  ?AWV: done 04/14/21 ?Colonoscopy: done 05/29/2002 ?DM Eye Exam:  overdue 08/27/15 ?DM Foot Exam: due 02/08/22 ?Microalbumin: done 02/08/21 ?HbgAIC: done 02/08/21 (9.3) ?DEXA: N/A ?Mammogram: N/A ? ? ?Star Rating Drugs: ?lisinopril (ZESTRIL) 10 MG tablet - mail order ?losartan (COZAAR) 50 MG tablet - last filled 05/10/21 90 days  ?simvastatin (ZOCOR) 10 MG tablet - last filled 06/26/21 90 days  ? ? ?Future Appointments  ?Date Time Provider [place]  ?04/27/2022  2:45 PM [facility]-NURSE HEALTH ADVISOR [facility]   ? ?Multiple attempts were made to contact patient. Attempts were unsuccessful. / ls,CMA  ? ? ?Liza Showfety, CCMA ?Clinical Pharmacist Assistant  ?[phone] ? ?

## 2021-08-19 ENCOUNTER — Telehealth: Payer: Self-pay

## 2021-08-19 DIAGNOSIS — G894 Chronic pain syndrome: Secondary | ICD-10-CM

## 2021-08-19 NOTE — Telephone Encounter (Signed)
Hydrocodone-acetaminophen ?LOV 06/27/21 ?Last refill 06/30/21, #90, 0 refills ? ?Please review, thanks! ? ?

## 2021-08-22 ENCOUNTER — Other Ambulatory Visit: Payer: Self-pay | Admitting: Family Medicine

## 2021-08-22 DIAGNOSIS — G894 Chronic pain syndrome: Secondary | ICD-10-CM

## 2021-08-22 MED ORDER — HYDROCODONE-ACETAMINOPHEN 5-325 MG PO TABS: 1.0000 | ORAL_TABLET | Freq: Four times a day (QID) | ORAL | 0 refills | Status: DC | PRN

## 2021-08-24 ENCOUNTER — Telehealth: Payer: Self-pay

## 2021-08-24 NOTE — Telephone Encounter (Signed)
Pt called asking about Libre freestyle? ?Call left msg to call back. ?

## 2021-09-06 ENCOUNTER — Encounter: Payer: Self-pay | Admitting: Podiatry

## 2021-09-06 ENCOUNTER — Telehealth: Payer: Self-pay | Admitting: Podiatry

## 2021-09-06 NOTE — Telephone Encounter (Signed)
Its fine for a note ?

## 2021-09-06 NOTE — Telephone Encounter (Signed)
Pt called and is needing a note for work that he is not able to wear the steel toed shoes due to his foot conditions. Would you be willing to write the note or would you need to see pt?  ?Pt asked for a call back after 230. ?

## 2021-09-08 ENCOUNTER — Telehealth: Payer: Self-pay | Admitting: Podiatry

## 2021-09-08 ENCOUNTER — Other Ambulatory Visit: Payer: Self-pay | Admitting: Family Medicine

## 2021-09-08 NOTE — Telephone Encounter (Signed)
Patient came in for note for steel toe shoes. Patient was wondering if the RN can call him about some advice on his shoes for work. Please call pt at [phone]. ?

## 2021-09-09 NOTE — Telephone Encounter (Signed)
Called patient - states he needed to work, but really couldn't wear the steel toe shoes. Advised he might could try a shoe repair shop ([employer]) to add a steel plate on the outside of one of his other shoes. ?

## 2021-09-22 ENCOUNTER — Other Ambulatory Visit: Payer: Self-pay | Admitting: Family Medicine

## 2021-09-22 DIAGNOSIS — G894 Chronic pain syndrome: Secondary | ICD-10-CM

## 2021-09-22 MED ORDER — HYDROCODONE-ACETAMINOPHEN 5-325 MG PO TABS: 1.0000 | ORAL_TABLET | Freq: Four times a day (QID) | ORAL | 0 refills | Status: DC | PRN

## 2021-09-22 NOTE — Telephone Encounter (Signed)
LOV 06/27/21 ?Last refill 08/22/21, #90, 0 refills ? ?Please review, thanks! ? ? ?

## 2021-09-22 NOTE — Telephone Encounter (Signed)
Patient left vm asking for a refill on his pain medication. ?CB # [phone] ?

## 2021-10-03 ENCOUNTER — Other Ambulatory Visit: Payer: Self-pay

## 2021-10-28 ENCOUNTER — Other Ambulatory Visit: Payer: Self-pay

## 2021-10-28 DIAGNOSIS — G894 Chronic pain syndrome: Secondary | ICD-10-CM

## 2021-10-28 MED ORDER — HYDROCODONE-ACETAMINOPHEN 5-325 MG PO TABS: 1.0000 | ORAL_TABLET | Freq: Four times a day (QID) | ORAL | 0 refills | Status: DC | PRN

## 2021-10-28 NOTE — Telephone Encounter (Signed)
Last filled 09/22/21 Last OV 06/27/21

## 2021-11-21 ENCOUNTER — Ambulatory Visit: Payer: Medicare HMO | Admitting: Podiatry

## 2021-11-21 DIAGNOSIS — L84 Corns and callosities: Secondary | ICD-10-CM

## 2021-11-21 DIAGNOSIS — B351 Tinea unguium: Secondary | ICD-10-CM | POA: Diagnosis not present

## 2021-11-21 DIAGNOSIS — M79675 Pain in left toe(s): Secondary | ICD-10-CM

## 2021-11-21 DIAGNOSIS — I739 Peripheral vascular disease, unspecified: Secondary | ICD-10-CM | POA: Diagnosis not present

## 2021-11-21 DIAGNOSIS — M79674 Pain in right toe(s): Secondary | ICD-10-CM | POA: Diagnosis not present

## 2021-11-21 DIAGNOSIS — E0840 Diabetes mellitus due to underlying condition with diabetic neuropathy, unspecified: Secondary | ICD-10-CM | POA: Diagnosis not present

## 2021-11-21 DIAGNOSIS — Z794 Long term (current) use of insulin: Secondary | ICD-10-CM

## 2021-11-21 MED ORDER — MUPIROCIN 2 % EX OINT: 1.0000 | TOPICAL_OINTMENT | Freq: Every day | CUTANEOUS | 2 refills | Status: DC

## 2021-11-24 ENCOUNTER — Encounter: Payer: Self-pay | Admitting: Podiatry

## 2021-11-24 NOTE — Progress Notes (Signed)
  Subjective:  Patient ID: Zachary Halt., male    DOB: 12-Apr-1946,  MRN: [id number]  Painful callus lesions and a sore under the fifth metatarsal, thickened elongated toenails  76 y.o. male presents with the above complaint. History confirmed with patient.   Objective:  Physical Exam: warm, good capillary refill, no trophic changes or ulcerative lesions, and normal DP and PT pulses. Left Foot: dystrophic yellowed discolored nail plates with subungual debris Right Foot:  Assessment:  No diagnosis found.   Plan:  Patient was evaluated and treated and all questions answered.  Patient educated on diabetes. Discussed proper diabetic foot care and discussed risks and complications of disease. Educated patient in depth on reasons to return to the office immediately should he/she discover anything concerning or new on the feet. All questions answered. Discussed proper shoes as well.   Discussed the etiology and treatment options for the condition in detail with the patient. Educated patient on the topical and oral treatment options for mycotic nails. Recommended debridement of the nails today. Sharp and mechanical debridement performed of all painful and mycotic nails today. Nails debrided in length and thickness using a nail nipper to level of comfort. Discussed treatment options including appropriate shoe gear. Follow up as needed for painful nails.  Updated ABIs were ordered  All symptomatic hyperkeratoses were safely debrided with a sterile #15 blade to patient's level of comfort without incident. We discussed preventative and palliative care of these lesions including supportive and accommodative shoegear, padding, prefabricated and custom molded accommodative orthoses, use of a pumice stone and lotions/creams daily.    No follow-ups on file.

## 2021-12-07 ENCOUNTER — Other Ambulatory Visit: Payer: Self-pay

## 2021-12-07 DIAGNOSIS — G894 Chronic pain syndrome: Secondary | ICD-10-CM

## 2021-12-07 NOTE — Telephone Encounter (Signed)
LOV 06/27/21 Last refill 10/28/21, #90, 0 refills  Please review, thanks!

## 2021-12-08 MED ORDER — HYDROCODONE-ACETAMINOPHEN 5-325 MG PO TABS: 1.0000 | ORAL_TABLET | Freq: Four times a day (QID) | ORAL | 0 refills | Status: DC | PRN

## 2021-12-23 ENCOUNTER — Other Ambulatory Visit: Payer: Medicare HMO

## 2022-02-03 ENCOUNTER — Other Ambulatory Visit: Payer: Self-pay | Admitting: Family Medicine

## 2022-02-03 ENCOUNTER — Telehealth: Payer: Self-pay

## 2022-02-03 DIAGNOSIS — G894 Chronic pain syndrome: Secondary | ICD-10-CM

## 2022-02-03 MED ORDER — HYDROCODONE-ACETAMINOPHEN 5-325 MG PO TABS: 1.0000 | ORAL_TABLET | Freq: Four times a day (QID) | ORAL | 0 refills | Status: DC | PRN

## 2022-02-03 NOTE — Telephone Encounter (Signed)
Pt called requesting RF on Norco/Vicodin 5/325. Pt uses [facility]. Pt's last RF was 12/08/2021. Last OV was 06/27/2021. Thank you.

## 2022-02-08 ENCOUNTER — Telehealth: Payer: Self-pay

## 2022-02-08 NOTE — Telephone Encounter (Signed)
Pt's Norco was sent to [facility] in [place] but pt does not use that pharmacy anymore. Pt asked [facility] [place] [facility]. Thank you.

## 2022-02-09 ENCOUNTER — Other Ambulatory Visit: Payer: Self-pay | Admitting: Family Medicine

## 2022-02-09 DIAGNOSIS — G894 Chronic pain syndrome: Secondary | ICD-10-CM

## 2022-02-09 MED ORDER — HYDROCODONE-ACETAMINOPHEN 5-325 MG PO TABS: 1.0000 | ORAL_TABLET | Freq: Four times a day (QID) | ORAL | 0 refills | Status: DC | PRN

## 2022-03-15 ENCOUNTER — Telehealth: Payer: Self-pay | Admitting: Family Medicine

## 2022-03-15 NOTE — Telephone Encounter (Signed)
Left message on voicemail to request call back; need to reschedule Medicare AWV appt with Amber.

## 2022-04-05 ENCOUNTER — Other Ambulatory Visit: Payer: Self-pay | Admitting: Family Medicine

## 2022-04-05 NOTE — Telephone Encounter (Signed)
Left messages on cell and landline to return call to reschedule Medicare AWV.

## 2022-04-05 NOTE — Telephone Encounter (Signed)
Requested medication (s) are due for refill today: unknown  Requested medication (s) are on the active medication list: yes  Last refill:  04/19/21  Future visit scheduled: no, last seen 06/27/21  Notes to clinic:  Historical Provider, please assess.       Requested Prescriptions  Pending Prescriptions Disp Refills   diltiazem (CARDIZEM CD) 120 MG 24 hr capsule [Pharmacy Med Name: DILTIAZEM HYDROCHLORIDE ER 120 MG Capsule Extended Release 24 Hour] 90 capsule 10    Sig: TAKE 1 CAPSULE EVERY DAY     Cardiovascular: Calcium Channel Blockers 3 Failed - 04/05/2022  5:13 PM      Failed - ALT in normal range and within 360 days    ALT  Date Value Ref Range Status  02/08/2021 13 9 - 46 U/L Final         Failed - AST in normal range and within 360 days    AST  Date Value Ref Range Status  02/08/2021 18 10 - 35 U/L Final         Failed - Cr in normal range and within 360 days    Creat  Date Value Ref Range Status  02/08/2021 1.25 0.70 - 1.28 mg/dL Final   Creatinine, Urine  Date Value Ref Range Status  08/24/2020 113 20 - 320 mg/dL Final         Failed - Valid encounter within last 6 months    Recent Outpatient Visits           9 months ago Compression fracture of L2 vertebra, initial encounter ([place])   [place] Susy Frizzle, MD   10 months ago Acute midline low back pain without sciatica   [place] Pickard, Cammie Mcgee, MD   10 months ago Strain of lumbar region, initial encounter   [place] Pickard, Cammie Mcgee, MD   1 year ago Chronic left shoulder pain   [place] Dennard Schaumann, Cammie Mcgee, MD   1 year ago Atrial fibrillation, unspecified type [facility])   [place], Cammie Mcgee, MD              Passed - Last BP in normal range    BP Readings from Last 1 Encounters:  06/27/21 128/68         Passed - Last Heart Rate in normal range    Pulse Readings from Last 1  Encounters:  06/27/21 79

## 2022-04-18 ENCOUNTER — Other Ambulatory Visit: Payer: Self-pay | Admitting: Family Medicine

## 2022-04-18 ENCOUNTER — Telehealth: Payer: Self-pay

## 2022-04-18 DIAGNOSIS — G894 Chronic pain syndrome: Secondary | ICD-10-CM

## 2022-04-18 MED ORDER — HYDROCODONE-ACETAMINOPHEN 5-325 MG PO TABS: 1.0000 | ORAL_TABLET | Freq: Four times a day (QID) | ORAL | 0 refills | Status: DC | PRN

## 2022-04-18 NOTE — Progress Notes (Signed)
PDMP reviewed and refill sent for Dr Dennard Schaumann

## 2022-04-18 NOTE — Telephone Encounter (Signed)
Pt called requesting a refill on his Hydrocodone 5/325. Last RF was 02/09/2022. [facility] [place] Thank you.

## 2022-04-25 ENCOUNTER — Other Ambulatory Visit: Payer: Self-pay | Admitting: Family Medicine

## 2022-04-25 ENCOUNTER — Other Ambulatory Visit: Payer: Self-pay

## 2022-04-25 DIAGNOSIS — I10 Essential (primary) hypertension: Secondary | ICD-10-CM

## 2022-04-25 DIAGNOSIS — I4891 Unspecified atrial fibrillation: Secondary | ICD-10-CM

## 2022-04-25 MED ORDER — DILTIAZEM HCL ER COATED BEADS 120 MG PO CP24: 120.0000 mg | ORAL_CAPSULE | Freq: Every day | ORAL | 0 refills | Status: DC

## 2022-04-27 ENCOUNTER — Ambulatory Visit (INDEPENDENT_AMBULATORY_CARE_PROVIDER_SITE_OTHER): Payer: Medicare HMO

## 2022-04-27 VITALS — Ht 68.0 in | Wt 155.0 lb

## 2022-04-27 DIAGNOSIS — Z0000 Encounter for general adult medical examination without abnormal findings: Secondary | ICD-10-CM

## 2022-04-27 NOTE — Progress Notes (Signed)
Subjective:   Zachary Reed. is a 76 y.o. male who presents for Medicare Annual/Subsequent preventive examination.  I connected with  [name] on 04/27/22 by a audio enabled telemedicine application and verified that I am speaking with the correct person using two identifiers.  Patient Location: Home  Provider Location: Office/Clinic  I discussed the limitations of evaluation and management by telemedicine. The patient expressed understanding and agreed to proceed.  Review of Systems     Cardiac Risk Factors include: advanced age (>29mn, >>25women);diabetes mellitus;dyslipidemia;male gender;hypertension     Objective:    Today's Vitals   04/27/22 1510  Weight: 155 lb (70.3 kg)  Height: _0  (1.727 m)   Body mass index is 23.57 kg/m.     04/27/2022    3:15 PM 06/15/2021    2:09 PM 04/14/2021    3:46 PM  Advanced Directives  Does Patient Have a Medical Advance Directive? Yes No Yes  Type of [place]  [place] will  Does patient want to make changes to medical advance directive? No - Patient declined    Copy of HViciin Chart? No - copy requested  No - copy requested  Would patient like information on creating a medical advance directive?  No - Patient declined No - Patient declined    Current Medications (verified) Outpatient Encounter Medications as of 04/27/2022  Medication Sig   ACCU-CHEK AVIVA PLUS test strip TEST BLOOD SUGAR FOUR TIMES DAILY (BEFORE MEALS AND AT BEDTIME)   Accu-Chek FastClix Lancets MISC USE AS DIRECTED.   aspirin 325 MG tablet Take 325 mg by mouth daily.   Blood Glucose Calibration (ACCU-CHEK AVIVA) SOLN Use as directed to calibrate glucometer   Blood Glucose Monitoring Suppl (ACCU-CHEK GUIDE) w/Device KIT USE AS DIRECTED   cyclobenzaprine (FLEXERIL) 10 MG tablet Take 1 tablet (10 mg total) by mouth 3 (three) times daily as needed for muscle  spasms.   diltiazem (CARDIZEM CD) 120 MG 24 hr capsule Take 1 capsule (120 mg total) by mouth daily.   glucagon (GLUCAGON EMERGENCY) 1 MG injection Inject 1 mg into the vein once as needed for up to 1 dose.   HYDROcodone-acetaminophen (NORCO/VICODIN) 5-325 MG tablet Take 1 tablet by mouth every 6 (six) hours as needed for moderate pain.   insulin glargine (LANTUS) 100 UNIT/ML Solostar Pen Inject 20 Units into the skin at bedtime.   Lancets Misc. (ACCU-CHEK FASTCLIX LANCET) KIT Use to check blood sugar   levothyroxine (SYNTHROID) 100 MCG tablet TAKE 1 TABLET EVERY DAY   lisinopril (ZESTRIL) 10 MG tablet lisinopril 10 mg tablet   losartan (COZAAR) 50 MG tablet TAKE 1 TABLET EVERY DAY   mupirocin ointment (BACTROBAN) 2 % Apply 1 Application topically daily.   sildenafil (VIAGRA) 100 MG tablet Take 0.5-1 tablets (50-100 mg total) by mouth daily as needed for erectile dysfunction.   silver sulfADIAZINE (SILVADENE) 1 % cream Apply 1 application topically daily.   simvastatin (ZOCOR) 10 MG tablet Take 10 mg by mouth at bedtime.   VITAMIN D-VITAMIN K PO Take by mouth daily in the afternoon.   zinc gluconate 50 MG tablet Take 50 mg by mouth daily.   No facility-administered encounter medications on file as of 04/27/2022.    Allergies (verified) Latex, Penicillins, and Shrimp [shellfish allergy]   History: Past Medical History:  Diagnosis Date   Anemia    iron def   [place]rial [place] [birth year][place][birth year][place]1951   rt leg   Diabetes mellitus without complication [facility])    Peripheral neuropathy sees podiatrist   ED (erectile dysfunction)    Hyperlipidemia 03/29/2012   Hypertension    Macular degeneration 08/27/2012   early signs per Dr [name]   Thyroid disease    hypothroidisn   Past Surgical History:  Procedure Laterality Date   cartlage surgery Right 1975   rt knee   EYE SURGERY Left 08/2012   cataract w/iol   FOOT TUMOR EXCISION Right 1974   History reviewed. No pertinent family  history. Social History   Socioeconomic History   Marital status: Married    Spouse name: Not on file   Number of children: Not on file   Years of education: Not on file   Highest education level: Not on file  Occupational History   Not on file  Tobacco Use   Smoking status: Former    Types: Cigarettes    Quit date: 09/26/1984    Years since quitting: 37.6   Smokeless tobacco: Never   Tobacco comments:    quit 1986  Vaping Use   Vaping Use: Never used  Substance and Sexual Activity   Alcohol use: No   Drug use: No   Sexual activity: Yes  Other Topics Concern   Not on file  Social History Narrative   Not on file   Social Determinants of Health   Financial Resource Strain: Low Risk  (04/27/2022)   Overall Financial Resource Strain (CARDIA)    Difficulty of Paying Living Expenses: Not hard at all  Food Insecurity: No Food Insecurity (04/27/2022)   Hunger Vital Sign    Worried About Running Out of Food in the Last Year: Never true    [place] in the Last Year: Never true  Transportation Needs: No Transportation Needs (04/27/2022)   PRAPARE - Hydrologist (Medical): No    Lack of Transportation (Non-Medical): No  Physical Activity: Inactive (04/27/2022)   Exercise Vital Sign    Days of Exercise per Week: 0 days    Minutes of Exercise per Session: 0 min  Stress: No Stress Concern Present (04/27/2022)   [place]    Feeling of Stress : Not at all  Social Connections: [place] (04/27/2022)   Social Connection and Isolation Panel [NHANES]    Frequency of Communication with Friends and Family: More than three times a week    Frequency of Social Gatherings with Friends and Family: Three times a week    Attends Religious Services: More than 4 times per year    Active Member of Clubs or Organizations: Yes    Attends Music therapist: More than 4 times  per year    Marital Status: Married    Tobacco Counseling Counseling given: Not Answered Tobacco comments: quit 1986   Clinical Intake:  Pre-visit preparation completed: Yes  Pain : No/denies pain  BMI - recorded: 23.57 Nutritional Status: BMI of 19-24  Normal Nutritional Risks: Non-healing wound Diabetes: Yes CBG done?: No Did pt. bring in CBG monitor from home?: No  How often do you need to have someone help you when you read instructions, pamphlets, or other written materials from your doctor or pharmacy?: 1 - Never  Diabetic?Yes  Nutrition Risk Assessment:  Has the patient had any N/V/D within the last 2 months?  No  Does the patient have any non-healing wounds?  Yes  Has the patient had any unintentional weight loss or weight gain?  No   Diabetes:  Is the patient diabetic?  Yes  If diabetic, was a CBG obtained today?  No  Did the patient bring in their glucometer from home?  No  How often do you monitor your CBG's? As directed .   Financial Strains and Diabetes Management:  Are you having any financial strains with the device, your supplies or your medication? No .  Does the patient want to be seen by Chronic Care Management for management of their diabetes?  No  Would the patient like to be referred to a Nutritionist or for Diabetic Management?  No   Diabetic Exams:  Diabetic Eye Exam: Overdue for diabetic eye exam. Pt has been advised about the importance in completing this exam. Patient advised to call and schedule an eye exam. Diabetic Foot Exam: Overdue, Pt has been advised about the importance in completing this exam. Pt is scheduled for diabetic foot exam on at next office visit .   Interpreter Needed?: No  Information entered by :: Denman George LPN   Activities of Daily Living    04/27/2022    3:15 PM  In your present state of health, do you have any difficulty performing the following activities:  Hearing? 0  Vision? 0  Difficulty  concentrating or making decisions? 0  Walking or climbing stairs? 0  Dressing or bathing? 0  Doing errands, shopping? 0  Preparing Food and eating ? N  Using the Toilet? N  In the past six months, have you accidently leaked urine? N  Do you have problems with loss of bowel control? N  Managing your Medications? N  Managing your Finances? N  Housekeeping or managing your Housekeeping? N    Patient Care Team: Susy Frizzle, MD as PCP - General (Family Medicine) Edythe Clarity, [facility] as Pharmacist (Pharmacist)  Indicate any recent Medical Services you may have received from other than [facility] providers in the past year (date may be approximate).     Assessment:   This is a routine wellness examination for [place].  Hearing/Vision screen Hearing Screening - Comments:: No concerns  Vision Screening - Comments:: No concerns; patient aware of recommendations for routine and diabetic eye exam    Dietary issues and exercise activities discussed: Current Exercise Habits: The patient does not participate in regular exercise at present   Goals Addressed             This Visit's Progress    DIET - REDUCE CALORIE INTAKE   On track    Pt would like to eat a healthier diet to help with diabetes.      Depression Screen    04/27/2022    3:12 PM 04/14/2021    3:42 PM 05/27/2020    2:44 PM 01/16/2019    2:45 PM 12/06/2017    3:25 PM 08/23/2017    3:28 PM 05/24/2017    3:15 PM  PHQ 2/9 Scores  PHQ - 2 Score 0 0 0 0 0 0 0    Fall Risk    04/27/2022    3:09 PM 04/14/2021    3:47 PM 02/08/2021    2:24 PM 05/27/2020    2:44 PM 12/06/2017    3:25 PM  [place] in the past year? 0 1 1 0 No  Number falls in past yr: 0 0 0    Injury with Fall? 0 0 0  Risk for fall due to : Impaired balance/gait Impaired balance/gait;Impaired mobility  No Fall Risks   Follow up Falls evaluation completed;Education provided;Falls prevention discussed Falls prevention discussed Falls  evaluation completed Falls evaluation completed     FALL RISK PREVENTION PERTAINING TO THE HOME:  Any stairs in or around the home? No  If so, are there any without handrails?  N/a Home free of loose throw rugs in walkways, pet beds, electrical cords, etc? Yes  Adequate lighting in your home to reduce risk of falls? Yes   ASSISTIVE DEVICES UTILIZED TO PREVENT FALLS:  Life alert? No  Use of a cane, walker or w/c? No  Grab bars in the bathroom? Yes  Shower chair or bench in shower? No  Elevated toilet seat or a handicapped toilet? No   TIMED UP AND GO:  Was the test performed? No .  Length of time to ambulate 10 feet: telephonic visit   Cognitive Function:        04/27/2022    3:15 PM 04/14/2021    3:49 PM  6CIT Screen  What Year? 0 points 0 points  What month? 0 points 0 points  What time? 0 points 0 points  Count back from 20 0 points 0 points  Months in reverse 0 points 4 points  Repeat phrase 0 points 0 points  Total Score 0 points 4 points    Immunizations Immunization History  Administered Date(s) Administered   Influenza Split 03/06/2009, 03/15/2011   Influenza-Unspecified 02/15/2017   Pneumococcal Polysaccharide-23 12/19/2000, 08/11/2011   Td 08/11/2011    TDAP status: Up to date  Flu Vaccine status: Declined, Education has been provided regarding the importance of this vaccine but patient still declined. Advised may receive this vaccine at local pharmacy or Health Dept. Aware to provide a copy of the vaccination record if obtained from local pharmacy or Health Dept. Verbalized acceptance and understanding.  Pneumococcal vaccine status: Declined,  Education has been provided regarding the importance of this vaccine but patient still declined. Advised may receive this vaccine at local pharmacy or Health Dept. Aware to provide a copy of the vaccination record if obtained from local pharmacy or Health Dept. Verbalized acceptance and understanding.   Covid-19  vaccine status: Information provided on how to obtain vaccines.   Qualifies for Shingles Vaccine? Yes   Zostavax completed No   Shingrix Completed?: No.    Education has been provided regarding the importance of this vaccine. Patient has been advised to call insurance company to determine out of pocket expense if they have not yet received this vaccine. Advised may also receive vaccine at local pharmacy or Health Dept. Verbalized acceptance and understanding.  Screening Tests Health Maintenance  Topic Date Due   Zoster Vaccines- Shingrix (1 of 2) Never done   Pneumonia Vaccine 30+ Years old (2 - PCV) 08/10/2012   OPHTHALMOLOGY EXAM  08/27/2015   HEMOGLOBIN A1C  08/08/2021   DTaP/Tdap/Td (2 - Tdap) 08/10/2021   Diabetic kidney evaluation - Urine ACR  08/24/2021   INFLUENZA VACCINE  12/27/2021   Diabetic kidney evaluation - GFR measurement  02/08/2022   FOOT EXAM  02/08/2022   Medicare Annual Wellness (AWV)  04/28/2023   Hepatitis C Screening  Completed   HPV VACCINES  Aged Out   COLONOSCOPY (Pts 45-56yr Insurance coverage will need to be confirmed)  Discontinued   COVID-19 Vaccine  Discontinued    Health Maintenance  Health Maintenance Due  Topic Date Due   Zoster Vaccines- Shingrix (1  of 2) Never done   Pneumonia Vaccine 77+ Years old (2 - PCV) 08/10/2012   OPHTHALMOLOGY EXAM  08/27/2015   HEMOGLOBIN A1C  08/08/2021   DTaP/Tdap/Td (2 - Tdap) 08/10/2021   Diabetic kidney evaluation - Urine ACR  08/24/2021   INFLUENZA VACCINE  12/27/2021   Diabetic kidney evaluation - GFR measurement  02/08/2022   FOOT EXAM  02/08/2022    Colorectal cancer screening: No longer required.   Lung Cancer Screening: (Low Dose CT Chest recommended if Age 50-80 years, 30 pack-year currently smoking OR have quit w/in 15years.) does not qualify.   Lung Cancer Screening Referral: n/a  Additional Screening:  Hepatitis C Screening: does qualify; Completed 02/21/17  Vision Screening: Recommended  annual ophthalmology exams for early detection of glaucoma and other disorders of the eye. Is the patient up to date with their annual eye exam?  No  Who is the provider or what is the name of the office in which the patient attends annual eye exams? Patient will establish with new provider of choice If pt is not established with a provider, would they like to be referred to a provider to establish care? No .   Dental Screening: Recommended annual dental exams for proper oral hygiene  Community Resource Referral / Chronic Care Management: CRR required this visit?  No   CCM required this visit?  No      Plan:     I have personally reviewed and noted the following in the patient's chart:   Medical and social history Use of alcohol, tobacco or illicit drugs  Current medications and supplements including opioid prescriptions. Patient is currently taking opioid prescriptions. Information provided to patient regarding non-opioid alternatives. Patient advised to discuss non-opioid treatment plan with their provider. Functional ability and status Nutritional status Physical activity Advanced directives List of other physicians Hospitalizations, surgeries, and ER visits in previous 12 months Vitals Screenings to include cognitive, depression, and falls Referrals and appointments  In addition, I have reviewed and discussed with patient certain preventive protocols, quality metrics, and best practice recommendations. A written personalized care plan for preventive services as well as general preventive health recommendations were provided to patient.     Vanetta Mulders, [place]   [id number]/94/[id number]   Due to this being a telephonic visit, the after visit summary with patients personalized plan was offered to patient via mail or my-chart. Per request, patient was mailed a copy of AVS   Nurse Notes: Patient is concerned about wound to left 3rd toe and has an appointment to follow up here on  05/09/22.

## 2022-04-27 NOTE — Patient Instructions (Signed)
Zachary Reed , Thank you for taking time to come for your Medicare Wellness Visit. I appreciate your ongoing commitment to your health goals. Please review the following plan we discussed and let me know if I can assist you in the future.   These are the goals we discussed:  Goals      DIET - REDUCE CALORIE INTAKE     Pt would like to eat a healthier diet to help with diabetes.     Pharmacy Care Plan:     CARE PLAN ENTRY  Current Barriers:  Chronic Disease Management support, education, and care coordination needs related to Hypertension, Hyperlipidemia, Diabetes, and Atrial Fibrillation   Hypertension BP Readings from Last 3 Encounters:  10/31/19 110/72  09/18/19 126/74  08/05/19 (!) 160/82  Pharmacist Clinical Goal(s): Over the next 90 days, patient will work with PharmD and providers to maintain BP goal <130/80 Current regimen:  Diltiazem '120mg'$  Lisinopril '20mg'$  Interventions: Reviewed home blood pressure readings Comprehensive medication review Patient self care activities - Over the next 90 days, patient will: Check BP when symptomatic, document, and provide at future appointments Ensure daily salt intake < 2300 mg/day  Hyperlipidemia Lab Results  Component Value Date/Time   [facility] 76 08/14/2019 02:34 PM  Pharmacist Clinical Goal(s): Over the next 90 days, patient will work with PharmD and providers to achieve LDL goal < 70 Current regimen:  Simvastatin '10mg'$  Interventions: Discussed dietary recommendations for lowering cholesterol Reviewed intensity of current statin medication, recommend increase to simvastatin '20mg'$  if lipids not WNL after diet modification at next PCP visit Patient self care activities - Over the next 90 days, patient will: Work on cutting chips and other carbohydrate snack foods back, continue to watch servings of sweets and sugary foods Present for lab work at next visit with Dr. Dennard Schaumann  Diabetes Lab Results  Component Value Date/Time   HGBA1C  8.1 (H) 08/14/2019 02:34 PM   HGBA1C 8.6 (H) 05/13/2019 02:39 PM   HGBA1C 7.8 (H) 11/23/2016 03:52 PM   HGBA1C 7.3 (H) 02/24/2016 03:54 PM  Pharmacist Clinical Goal(s): Over the next 90 days, patient will work with PharmD and providers to achieve A1c goal <7% Current regimen:  Metformin '500mg'$  twice daily Pioglitazone '15mg'$  daily Novolog 100u/ml 4 units three times daily Lantus 100u/mL 12 units at bedtime Interventions: Reviewed home blood sugar logs Counseled on diet modifications to regulate blood sugar Developed dietary plan for moving forward Patient self care activities - Over the next 90 days, patient will: Check blood sugar 3-4 times daily, document, and provide at future appointments Contact provider with any episodes of hypoglycemia Work on cutting back on foods knows to have high carbohydrate content  Afib Pharmacist Clinical Goal(s) Over the next 90 days, patient will work with PharmD and providers to optimize medication related to Afib. Current regimen:  Diltiazem '120mg'$  daily Warfarin '5mg'$  Interventions: Comprehensive medication review Reviewed last few INRs Patient self care activities - Over the next 90 days, patient will: Focus on medication adherence by pill count. Contact PharmD or PCP with any abnormal bruising or bleeding.  Initial goal documentation         This is a list of the screening recommended for you and due dates:  Health Maintenance  Topic Date Due   Zoster (Shingles) Vaccine (1 of 2) Never done   Pneumonia Vaccine (2 - PCV) 08/10/2012   Eye exam for diabetics  08/27/2015   Hemoglobin A1C  08/08/2021   DTaP/Tdap/Td vaccine (2 - Tdap) 08/10/2021  Yearly kidney health urinalysis for diabetes  08/24/2021   Flu Shot  12/27/2021   Yearly kidney function blood test for diabetes  02/08/2022   Complete foot exam   02/08/2022   Medicare Annual Wellness Visit  04/28/2023   Hepatitis C Screening: USPSTF Recommendation to screen - Ages 18-79 yo.   Completed   HPV Vaccine  Aged Out   Colon Cancer Screening  Discontinued   COVID-19 Vaccine  Discontinued    Advanced directives: Please bring a copy of your health care power of attorney and living will to the office to be added to your chart at your convenience.   Conditions/risks identified: Aim for 30 minutes of exercise or brisk walking, 6-8 glasses of water, and 5 servings of fruits and vegetables each day.   Next appointment: Follow up in one year for your annual wellness visit.   Preventive Care 56 Years and Older, Male  Preventive care refers to lifestyle choices and visits with your health care provider that can promote health and wellness. What does preventive care include? A yearly physical exam. This is also called an annual well check. Dental exams once or twice a year. Routine eye exams. Ask your health care provider how often you should have your eyes checked. Personal lifestyle choices, including: Daily care of your teeth and gums. Regular physical activity. Eating a healthy diet. Avoiding tobacco and drug use. Limiting alcohol use. Practicing safe sex. Taking low doses of aspirin every day. Taking vitamin and mineral supplements as recommended by your health care provider. What happens during an annual well check? The services and screenings done by your health care provider during your annual well check will depend on your age, overall health, lifestyle risk factors, and family history of disease. Counseling  Your health care provider may ask you questions about your: Alcohol use. Tobacco use. Drug use. Emotional well-being. Home and relationship well-being. Sexual activity. Eating habits. History of falls. Memory and ability to understand (cognition). Work and work Statistician. Screening  You may have the following tests or measurements: Height, weight, and BMI. Blood pressure. Lipid and cholesterol levels. These may be checked every 5 years, or more  frequently if you are over 50 years old. Skin check. Lung cancer screening. You may have this screening every year starting at age 24 if you have a 30-pack-year history of smoking and currently smoke or have quit within the past 15 years. Fecal occult blood test (FOBT) of the stool. You may have this test every year starting at age 42. Flexible sigmoidoscopy or colonoscopy. You may have a sigmoidoscopy every 5 years or a colonoscopy every 10 years starting at age 28. Prostate cancer screening. Recommendations will vary depending on your family history and other risks. Hepatitis C blood test. Hepatitis B blood test. Sexually transmitted disease (STD) testing. Diabetes screening. This is done by checking your blood sugar (glucose) after you have not eaten for a while (fasting). You may have this done every 1-3 years. Abdominal aortic aneurysm (AAA) screening. You may need this if you are a current or former smoker. Osteoporosis. You may be screened starting at age 20 if you are at high risk. Talk with your health care provider about your test results, treatment options, and if necessary, the need for more tests. Vaccines  Your health care provider may recommend certain vaccines, such as: Influenza vaccine. This is recommended every year. Tetanus, diphtheria, and acellular pertussis (Tdap, Td) vaccine. You may need a Td booster every 10 years.  Zoster vaccine. You may need this after age 60. Pneumococcal 13-valent conjugate (PCV13) vaccine. One dose is recommended after age 58. Pneumococcal polysaccharide (PPSV23) vaccine. One dose is recommended after age 3. Talk to your health care provider about which screenings and vaccines you need and how often you need them. This information is not intended to replace advice given to you by your health care provider. Make sure you discuss any questions you have with your health care provider. Document Released: 06/11/2015 Document Revised: 02/02/2016  Document Reviewed: 03/16/2015 Elsevier Interactive Patient Education  2017 [place] Prevention in the Home Falls can cause injuries. They can happen to people of all ages. There are many things you can do to make your home safe and to help prevent falls. What can I do on the outside of my home? Regularly fix the edges of walkways and driveways and fix any cracks. Remove anything that might make you trip as you walk through a door, such as a raised step or threshold. Trim any bushes or trees on the path to your home. Use bright outdoor lighting. Clear any walking paths of anything that might make someone trip, such as rocks or tools. Regularly check to see if handrails are loose or broken. Make sure that both sides of any steps have handrails. Any raised decks and porches should have guardrails on the edges. Have any leaves, snow, or ice cleared regularly. Use sand or salt on walking paths during winter. Clean up any spills in your garage right away. This includes oil or grease spills. What can I do in the bathroom? Use night lights. Install grab bars by the toilet and in the tub and shower. Do not use towel bars as grab bars. Use non-skid mats or decals in the tub or shower. If you need to sit down in the shower, use a plastic, non-slip stool. Keep the floor dry. Clean up any water that spills on the floor as soon as it happens. Remove soap buildup in the tub or shower regularly. Attach bath mats securely with double-sided non-slip rug tape. Do not have throw rugs and other things on the floor that can make you trip. What can I do in the bedroom? Use night lights. Make sure that you have a light by your bed that is easy to reach. Do not use any sheets or blankets that are too big for your bed. They should not hang down onto the floor. Have a firm chair that has side arms. You can use this for support while you get dressed. Do not have throw rugs and other things on the floor  that can make you trip. What can I do in the kitchen? Clean up any spills right away. Avoid walking on wet floors. Keep items that you use a lot in easy-to-reach places. If you need to reach something above you, use a strong step stool that has a grab bar. Keep electrical cords out of the way. Do not use floor polish or wax that makes floors slippery. If you must use wax, use non-skid floor wax. Do not have throw rugs and other things on the floor that can make you trip. What can I do with my stairs? Do not leave any items on the stairs. Make sure that there are handrails on both sides of the stairs and use them. Fix handrails that are broken or loose. Make sure that handrails are as long as the stairways. Check any carpeting to make sure that  it is firmly attached to the stairs. Fix any carpet that is loose or worn. Avoid having throw rugs at the top or bottom of the stairs. If you do have throw rugs, attach them to the floor with carpet tape. Make sure that you have a light switch at the top of the stairs and the bottom of the stairs. If you do not have them, ask someone to add them for you. What else can I do to help prevent falls? Wear shoes that: Do not have high heels. Have rubber bottoms. Are comfortable and fit you well. Are closed at the toe. Do not wear sandals. If you use a stepladder: Make sure that it is fully opened. Do not climb a closed stepladder. Make sure that both sides of the stepladder are locked into place. Ask someone to hold it for you, if possible. Clearly mark and make sure that you can see: Any grab bars or handrails. First and last steps. Where the edge of each step is. Use tools that help you move around (mobility aids) if they are needed. These include: Canes. Walkers. Scooters. Crutches. Turn on the lights when you go into a dark area. Replace any light bulbs as soon as they burn out. Set up your furniture so you have a clear path. Avoid moving your  furniture around. If any of your floors are uneven, fix them. If there are any pets around you, be aware of where they are. Review your medicines with your doctor. Some medicines can make you feel dizzy. This can increase your chance of falling. Ask your doctor what other things that you can do to help prevent falls. This information is not intended to replace advice given to you by your health care provider. Make sure you discuss any questions you have with your health care provider. Document Released: 03/11/2009 Document Revised: 10/21/2015 Document Reviewed: 06/19/2014 Elsevier Interactive Patient Education  2017 [employer].

## 2022-05-09 ENCOUNTER — Ambulatory Visit: Payer: Medicare HMO | Admitting: Family Medicine

## 2022-05-17 ENCOUNTER — Other Ambulatory Visit: Payer: Self-pay | Admitting: Family Medicine

## 2022-05-17 DIAGNOSIS — I4891 Unspecified atrial fibrillation: Secondary | ICD-10-CM

## 2022-05-17 DIAGNOSIS — I10 Essential (primary) hypertension: Secondary | ICD-10-CM

## 2022-05-17 NOTE — Telephone Encounter (Signed)
Called patient to schedule appt for medication refills. No answer, LVMTCB [phone].

## 2022-05-17 NOTE — Telephone Encounter (Signed)
Requested medication (s) are due for refill today: yes  Requested medication (s) are on the active medication list: yes  Last refill:  04/25/22 #30 0 refills  Future visit scheduled: no  Notes to clinic:  protocol failed. Last OV 06/27/21. Called patient to schedule appt. No answer, LVMTCB. Do you want to refill Rx?     Requested Prescriptions  Pending Prescriptions Disp Refills   diltiazem (CARDIZEM CD) 120 MG 24 hr capsule [Pharmacy Med Name: DILTIAZEM HYDROCHLORIDE ER 120 MG Capsule Extended Release 24 Hour] 30 capsule 3    Sig: TAKE 1 CAPSULE EVERY DAY (NEED MD APPOINTMENT)     Cardiovascular: Calcium Channel Blockers 3 Failed - 05/17/2022 12:06 PM      Failed - ALT in normal range and within 360 days    ALT  Date Value Ref Range Status  02/08/2021 13 9 - 46 U/L Final         Failed - AST in normal range and within 360 days    AST  Date Value Ref Range Status  02/08/2021 18 10 - 35 U/L Final         Failed - Cr in normal range and within 360 days    Creat  Date Value Ref Range Status  02/08/2021 1.25 0.70 - 1.28 mg/dL Final   Creatinine, Urine  Date Value Ref Range Status  08/24/2020 113 20 - 320 mg/dL Final         Failed - Valid encounter within last 6 months    Recent Outpatient Visits           10 months ago Compression fracture of L2 vertebra, initial encounter ([place])   [place] Susy Frizzle, MD   11 months ago Acute midline low back pain without sciatica   [place] Pickard, Cammie Mcgee, MD   1 year ago Strain of lumbar region, initial encounter   [place] Pickard, Cammie Mcgee, MD   1 year ago Chronic left shoulder pain   [place] Dennard Schaumann, Cammie Mcgee, MD   1 year ago Atrial fibrillation, unspecified type [facility])   [place], Cammie Mcgee, MD              Passed - Last BP in normal range    BP Readings from Last 1 Encounters:  06/27/21 128/68          Passed - Last Heart Rate in normal range    Pulse Readings from Last 1 Encounters:  06/27/21 79

## 2022-05-30 ENCOUNTER — Other Ambulatory Visit: Payer: Self-pay | Admitting: Family Medicine

## 2022-06-15 ENCOUNTER — Other Ambulatory Visit: Payer: Self-pay

## 2022-06-15 DIAGNOSIS — I10 Essential (primary) hypertension: Secondary | ICD-10-CM

## 2022-06-15 DIAGNOSIS — I4891 Unspecified atrial fibrillation: Secondary | ICD-10-CM

## 2022-06-15 MED ORDER — DILTIAZEM HCL ER COATED BEADS 120 MG PO CP24: 120.0000 mg | ORAL_CAPSULE | Freq: Every day | ORAL | 0 refills | Status: DC

## 2022-06-19 ENCOUNTER — Other Ambulatory Visit: Payer: Self-pay | Admitting: Family Medicine

## 2022-07-06 ENCOUNTER — Other Ambulatory Visit: Payer: Self-pay | Admitting: Family Medicine

## 2022-07-06 DIAGNOSIS — I10 Essential (primary) hypertension: Secondary | ICD-10-CM

## 2022-07-06 DIAGNOSIS — I4891 Unspecified atrial fibrillation: Secondary | ICD-10-CM

## 2022-07-06 NOTE — Telephone Encounter (Signed)
Requested medication (s) are due for refill today: yes  Requested medication (s) are on the active medication list: yes  Last refill:  06/15/22 #30 (courtesy refill)  Future visit scheduled: no  Notes to clinic:  called pt and LM on [facility] to call and make appointment for further refills/overdue lab work   Requested Prescriptions  Pending Prescriptions Disp Refills   diltiazem (CARDIZEM CD) 120 MG 24 hr capsule [Pharmacy Med Name: DILTIAZEM HYDROCHLORIDE ER 120 MG Capsule Extended Release 24 Hour] 30 capsule 3    Sig: TAKE 1 CAPSULE EVERY DAY (NEED MD APPOINTMENT)     Cardiovascular: Calcium Channel Blockers 3 Failed - 07/06/2022  2:48 AM      Failed - ALT in normal range and within 360 days    ALT  Date Value Ref Range Status  02/08/2021 13 9 - 46 U/L Final         Failed - AST in normal range and within 360 days    AST  Date Value Ref Range Status  02/08/2021 18 10 - 35 U/L Final         Failed - Cr in normal range and within 360 days    Creat  Date Value Ref Range Status  02/08/2021 1.25 0.70 - 1.28 mg/dL Final   Creatinine, Urine  Date Value Ref Range Status  08/24/2020 113 20 - 320 mg/dL Final         Failed - Valid encounter within last 6 months    Recent Outpatient Visits           1 year ago Compression fracture of L2 vertebra, initial encounter ([place])   [place] Susy Frizzle, MD   1 year ago Acute midline low back pain without sciatica   [place] Pickard, Cammie Mcgee, MD   1 year ago Strain of lumbar region, initial encounter   [place] Pickard, Cammie Mcgee, MD   1 year ago Chronic left shoulder pain   [place] Dennard Schaumann, Cammie Mcgee, MD   1 year ago Atrial fibrillation, unspecified type [facility])   [place], Cammie Mcgee, MD              Passed - Last BP in normal range    BP Readings from Last 1 Encounters:  06/27/21 128/68         Passed - Last Heart Rate  in normal range    Pulse Readings from Last 1 Encounters:  06/27/21 79

## 2022-08-05 ENCOUNTER — Other Ambulatory Visit: Payer: Self-pay | Admitting: Family Medicine

## 2022-08-08 ENCOUNTER — Encounter: Payer: Self-pay | Admitting: Family Medicine

## 2022-08-08 ENCOUNTER — Ambulatory Visit (INDEPENDENT_AMBULATORY_CARE_PROVIDER_SITE_OTHER): Payer: Medicare HMO | Admitting: Family Medicine

## 2022-08-08 VITALS — BP 132/80 | HR 44 | Temp 98.9°F | Ht 68.0 in | Wt 146.0 lb

## 2022-08-08 DIAGNOSIS — I739 Peripheral vascular disease, unspecified: Secondary | ICD-10-CM

## 2022-08-08 DIAGNOSIS — Z794 Long term (current) use of insulin: Secondary | ICD-10-CM | POA: Diagnosis not present

## 2022-08-08 DIAGNOSIS — E119 Type 2 diabetes mellitus without complications: Secondary | ICD-10-CM

## 2022-08-08 DIAGNOSIS — E1151 Type 2 diabetes mellitus with diabetic peripheral angiopathy without gangrene: Secondary | ICD-10-CM | POA: Diagnosis not present

## 2022-08-08 DIAGNOSIS — I96 Gangrene, not elsewhere classified: Secondary | ICD-10-CM
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
## 2022-08-11 ENCOUNTER — Other Ambulatory Visit: Payer: Self-pay | Admitting: Family Medicine

## 2022-08-11 DIAGNOSIS — G894 Chronic pain syndrome: Secondary | ICD-10-CM

## 2022-08-11 MED ORDER — HYDROCODONE-ACETAMINOPHEN 5-325 MG PO TABS: 1.0000 | ORAL_TABLET | Freq: Four times a day (QID) | ORAL | 0 refills | Status: DC | PRN

## 2022-08-11 NOTE — Telephone Encounter (Signed)
Prescription Request  08/11/2022  LOV: 08/08/2022  What is the name of the medication or equipment?   HYDROcodone-acetaminophen (NORCO/VICODIN) 5-325 MG tablet   Have you contacted your pharmacy to request a refill? Yes   Which pharmacy would you like this sent to?   [id number] [address], [place] [address] [place] [place] [address] Phone: [phone] Fax: [phone]    Patient notified that their request is being sent to the clinical staff for review and that they should receive a response within 2 business days.   Please advise when script called in at [phone]

## 2022-08-13 ENCOUNTER — Other Ambulatory Visit: Payer: Self-pay | Admitting: Family Medicine

## 2022-08-16 ENCOUNTER — Other Ambulatory Visit: Payer: Self-pay

## 2022-08-16 ENCOUNTER — Ambulatory Visit: Payer: Medicare HMO | Admitting: Family

## 2022-08-16 ENCOUNTER — Encounter: Payer: Self-pay | Admitting: Family

## 2022-08-16 DIAGNOSIS — I96 Gangrene, not elsewhere classified: Secondary | ICD-10-CM | POA: Diagnosis not present
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
## 2022-08-31 ENCOUNTER — Other Ambulatory Visit: Payer: Self-pay | Admitting: Family Medicine

## 2022-09-13 ENCOUNTER — Ambulatory Visit: Payer: Medicare HMO | Admitting: Family

## 2022-10-25 ENCOUNTER — Other Ambulatory Visit (INDEPENDENT_AMBULATORY_CARE_PROVIDER_SITE_OTHER): Payer: Self-pay | Admitting: Vascular Surgery

## 2022-10-25 DIAGNOSIS — I96 Gangrene, not elsewhere classified: Secondary | ICD-10-CM

## 2022-10-27 ENCOUNTER — Other Ambulatory Visit (INDEPENDENT_AMBULATORY_CARE_PROVIDER_SITE_OTHER): Payer: Self-pay | Admitting: Vascular Surgery

## 2022-10-27 ENCOUNTER — Ambulatory Visit (INDEPENDENT_AMBULATORY_CARE_PROVIDER_SITE_OTHER): Payer: Medicare HMO | Admitting: Nurse Practitioner

## 2022-10-27 ENCOUNTER — Ambulatory Visit (INDEPENDENT_AMBULATORY_CARE_PROVIDER_SITE_OTHER): Payer: Medicare HMO

## 2022-10-27 ENCOUNTER — Encounter (INDEPENDENT_AMBULATORY_CARE_PROVIDER_SITE_OTHER): Payer: Self-pay | Admitting: Nurse Practitioner

## 2022-10-27 ENCOUNTER — Other Ambulatory Visit (INDEPENDENT_AMBULATORY_CARE_PROVIDER_SITE_OTHER): Payer: Self-pay | Admitting: Nurse Practitioner

## 2022-10-27 VITALS — BP 120/65 | HR 60 | Resp 16 | Wt 143.4 lb

## 2022-10-27 DIAGNOSIS — E1165 Type 2 diabetes mellitus with hyperglycemia: Secondary | ICD-10-CM | POA: Diagnosis not present

## 2022-10-27 DIAGNOSIS — I70262 Atherosclerosis of native arteries of extremities with gangrene, left leg: Secondary | ICD-10-CM

## 2022-10-27 DIAGNOSIS — I10 Essential (primary) hypertension: Secondary | ICD-10-CM

## 2022-10-27 DIAGNOSIS — I96 Gangrene, not elsewhere classified: Secondary | ICD-10-CM | POA: Diagnosis not present

## 2022-10-27 DIAGNOSIS — T148XXA Other injury of unspecified body region, initial encounter: Secondary | ICD-10-CM | POA: Diagnosis not present

## 2022-10-30 ENCOUNTER — Encounter (INDEPENDENT_AMBULATORY_CARE_PROVIDER_SITE_OTHER): Payer: Self-pay | Admitting: Nurse Practitioner

## 2022-10-30 LAB — VAS US LOWER EXT ART SEG MULTI (SEGMENTALS & LE RAYNAUDS)
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
## 2022-10-31 ENCOUNTER — Telehealth (INDEPENDENT_AMBULATORY_CARE_PROVIDER_SITE_OTHER): Payer: Self-pay

## 2022-10-31 ENCOUNTER — Telehealth: Payer: Self-pay

## 2022-10-31 NOTE — Telephone Encounter (Signed)
Spoke with the patient's daughter and she stated she would give him my contact information for him to return my call.

## 2022-10-31 NOTE — Telephone Encounter (Signed)
Prescription Request  10/31/2022  LOV: Visit date not found  What is the name of the medication or equipment? HYDROcodone-acetaminophen (NORCO/VICODIN) 5-325 MG tablet [[id number]]  Have you contacted your pharmacy to request a refill? No   Which pharmacy would you like this sent to? [id number] [place], [place] - [address] [place] [address] Phone: [phone]  Fax: [phone]     Patient notified that their request is being sent to the clinical staff for review and that they should receive a response within 2 business days.   Please advise at [facility] [phone]

## 2022-11-01 ENCOUNTER — Other Ambulatory Visit: Payer: Self-pay | Admitting: Family Medicine

## 2022-11-01 DIAGNOSIS — G894 Chronic pain syndrome: Secondary | ICD-10-CM

## 2022-11-01 LAB — AEROBIC CULTURE

## 2022-11-01 MED ORDER — HYDROCODONE-ACETAMINOPHEN 5-325 MG PO TABS: 1.0000 | ORAL_TABLET | Freq: Four times a day (QID) | ORAL | 0 refills | Status: DC | PRN

## 2022-11-02 ENCOUNTER — Other Ambulatory Visit (INDEPENDENT_AMBULATORY_CARE_PROVIDER_SITE_OTHER): Payer: Self-pay | Admitting: Nurse Practitioner

## 2022-11-02 MED ORDER — SULFAMETHOXAZOLE-TRIMETHOPRIM 800-160 MG PO TABS: 1.0000 | ORAL_TABLET | Freq: Two times a day (BID) | ORAL | 0 refills | Status: DC

## 2022-11-02 NOTE — Progress Notes (Signed)
Patient notified that culture results has been received and prescription was sent to pharmacy

## 2022-11-02 NOTE — Progress Notes (Signed)
Wound culture did show some active infection, have sent in Abx to pharmacy on file, if you can contact patient to let them know

## 2022-11-02 NOTE — Progress Notes (Signed)
Pt's daughter called back regarding a missed call. I tried to call daughter back with no answer and I called the home number and left a VM that the antibiotic was sent to the pharmacy today and if there are any further questions to call our office back.

## 2022-11-03 ENCOUNTER — Telehealth (INDEPENDENT_AMBULATORY_CARE_PROVIDER_SITE_OTHER): Payer: Self-pay

## 2022-11-03 NOTE — Telephone Encounter (Signed)
Spoke with the patient's daughter again and asked that she have him call me. The daughter stated she told him to call and that he is now taking the antibiotic that was called in as well.

## 2022-11-08 ENCOUNTER — Ambulatory Visit: Payer: Medicare HMO | Admitting: Internal Medicine

## 2022-11-20 ENCOUNTER — Telehealth (INDEPENDENT_AMBULATORY_CARE_PROVIDER_SITE_OTHER): Payer: Self-pay

## 2022-11-20 NOTE — Telephone Encounter (Signed)
I spoke with the patient's daughter again and this time was told that if her dad wanted to do the procedure he would call. Patient's daughter had an attitude because this was my third time calling. I stated that I would make a note of the the patient calling if he wanted to have a procedure.

## 2022-12-07 ENCOUNTER — Ambulatory Visit: Payer: Medicare HMO | Admitting: Physician Assistant

## 2023-01-01 ENCOUNTER — Telehealth: Payer: Self-pay | Admitting: Family Medicine

## 2023-01-01 NOTE — Telephone Encounter (Signed)
Prescription Request  01/01/2023  LOV: 08/08/2022  What is the name of the medication or equipment?   HYDROcodone-acetaminophen (NORCO/VICODIN) 5-325 MG tablet   Have you contacted your pharmacy to request a refill? Yes   Which pharmacy would you like this sent to?  [facility] - [place], [place] - [address] [place] [address] Phone: [phone] Fax: [phone]    Patient notified that their request is being sent to the clinical staff for review and that they should receive a response within 2 business days.   Please advise pharmacist.

## 2023-01-02 ENCOUNTER — Other Ambulatory Visit: Payer: Self-pay | Admitting: Family Medicine

## 2023-01-02 DIAGNOSIS — G894 Chronic pain syndrome: Secondary | ICD-10-CM

## 2023-01-02 MED ORDER — HYDROCODONE-ACETAMINOPHEN 5-325 MG PO TABS: 1.0000 | ORAL_TABLET | Freq: Four times a day (QID) | ORAL | 0 refills | Status: DC | PRN

## 2023-02-03 ENCOUNTER — Other Ambulatory Visit: Payer: Self-pay | Admitting: Family Medicine

## 2023-02-05 NOTE — Telephone Encounter (Signed)
Requested Prescriptions  Pending Prescriptions Disp Refills   glucose blood (ACCU-CHEK AVIVA PLUS) test strip [Pharmacy Med Name: Accu-Chek Aviva Plus In Vitro Strip] 400 strip 0    Sig: TEST BLOOD SUGAR FOUR TIMES DAILY (BEFORE MEALS AND AT BEDTIME)     Endocrinology: Diabetes - Testing Supplies Failed - 02/03/2023  2:31 AM      Failed - Valid encounter within last 12 months    Recent Outpatient Visits           1 year ago Compression fracture of L2 vertebra, initial encounter (HCC)   [facility] Donita Brooks, MD   1 year ago Acute midline low back pain without sciatica   [facility] Tanya Nones Priscille Heidelberg, MD   1 year ago Strain of lumbar region, initial encounter   [facility] Pickard, Priscille Heidelberg, MD   1 year ago Chronic left shoulder pain   [facility] Donita Brooks, MD   2 years ago Atrial fibrillation, unspecified type [facility])   [facility] Pickard, Priscille Heidelberg, MD

## 2023-02-08 IMAGING — CR DG LUMBAR SPINE COMPLETE 4+V
5 series · 5 of 5 positions shown · non-contrast
Comparison: None.

CLINICAL DATA: low back pain

EXAM:
LUMBAR SPINE - COMPLETE 4+ VIEW

[l-spine ap]
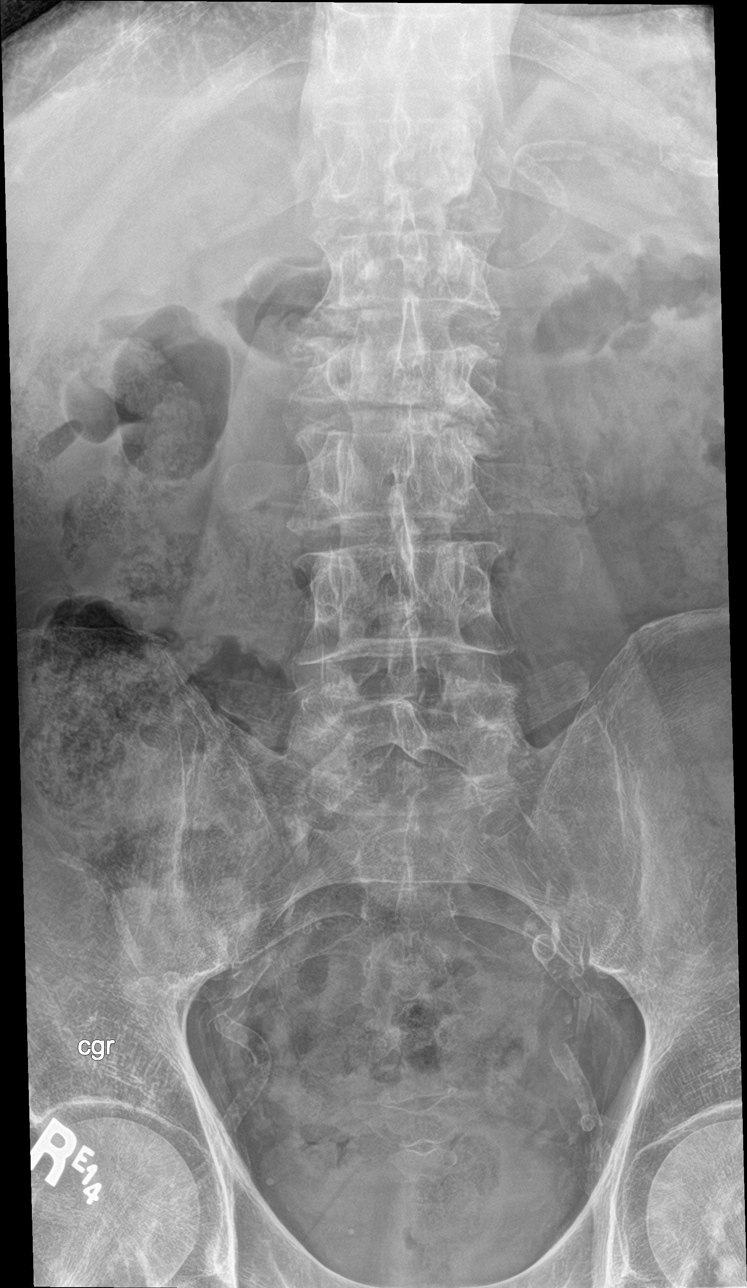

[l-spine obl (1 of 2)]
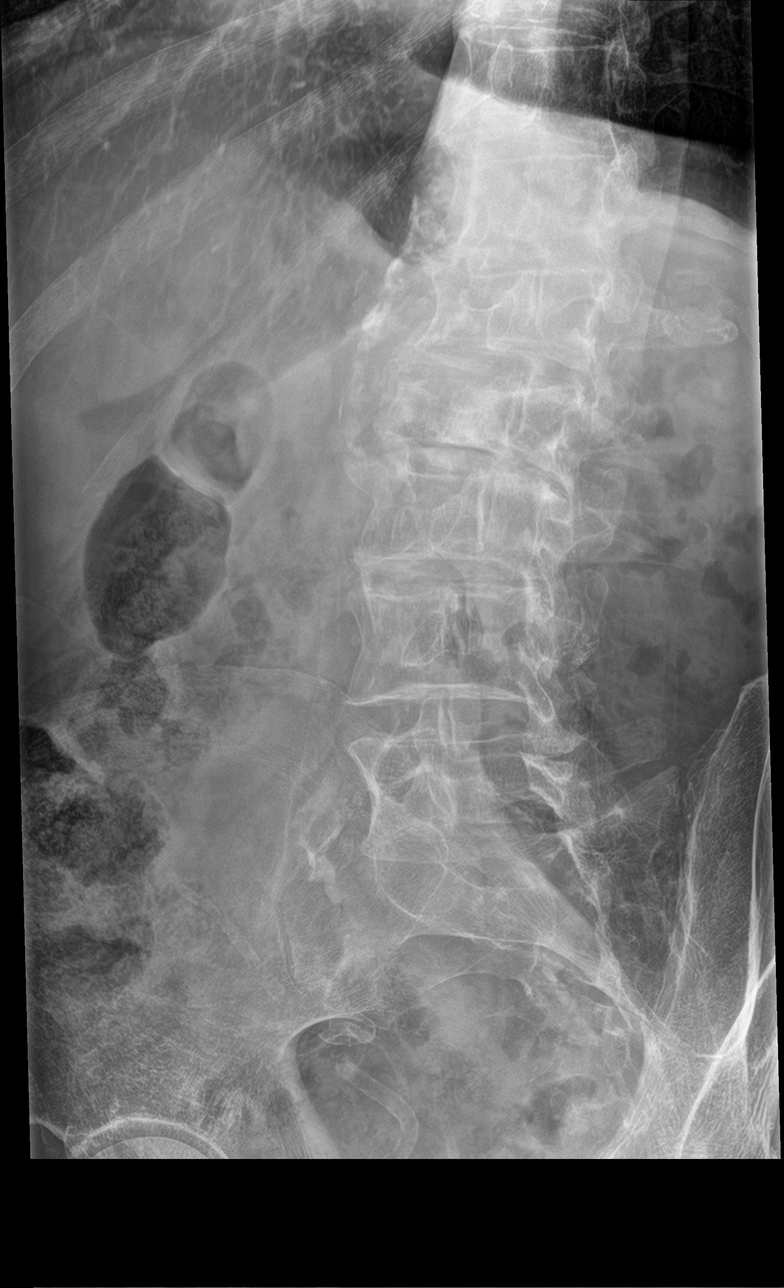

[l-spine obl (2 of 2)]
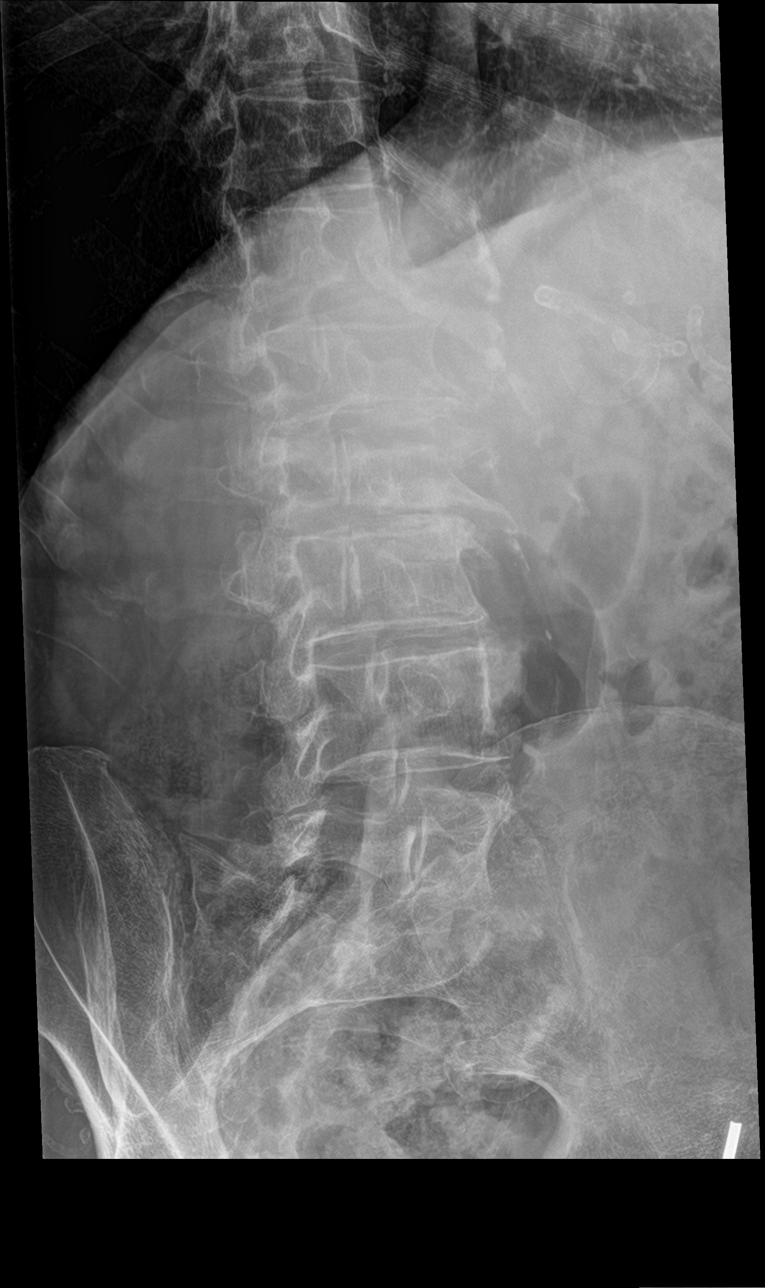

[l-spine lat]
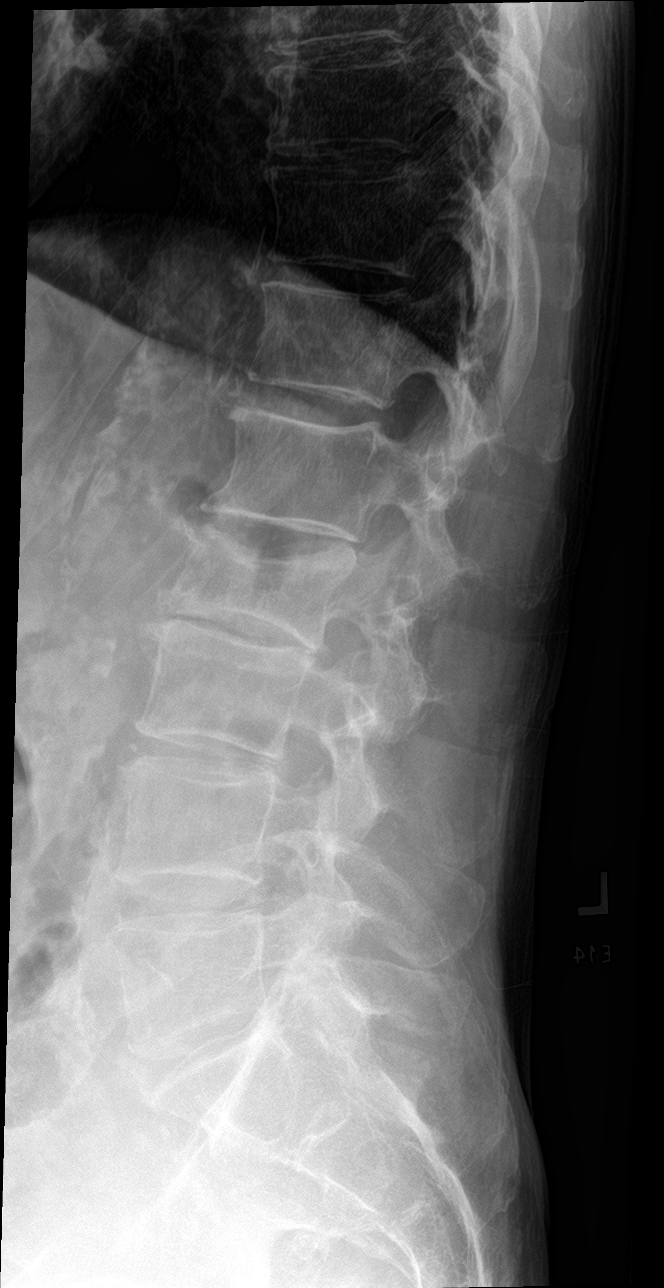

[l-spine spot]
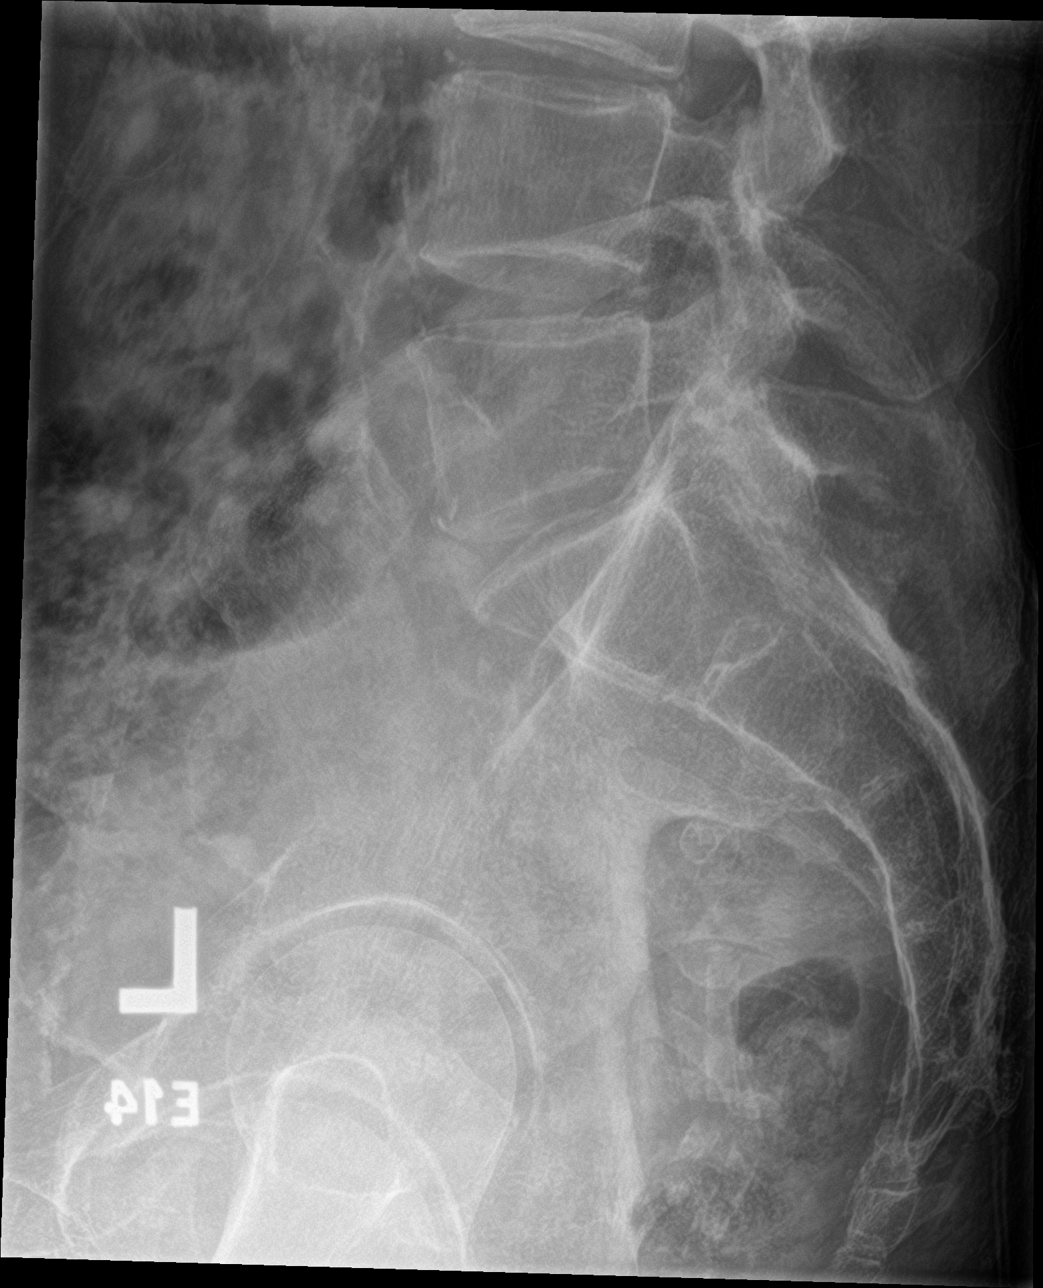

[5 of 5 positions shown; findings below may reference images not displayed]

FINDINGS: Five non-rib-bearing lumbar vertebral bodies. L2 compression
fracture with approximately 50% height loss. Mild multilevel
degenerative changes spine. Atherosclerotic plaque. Alignment is
otherwise unremarkable.
IMPRESSION: 1. L2 compression fracture with approximately 50% height loss.
Correlate with point tenderness to palpation to evaluate for an
acute component.
2.  Aortic Atherosclerosis ([id number]-611.1).

## 2023-04-20 ENCOUNTER — Other Ambulatory Visit: Payer: Self-pay | Admitting: Family Medicine

## 2023-04-20 NOTE — Telephone Encounter (Signed)
Last OV 08/11/22 Requested Prescriptions  Pending Prescriptions Disp Refills   ACCU-CHEK AVIVA PLUS test strip [Pharmacy Med Name: Accu-Chek Aviva Plus In Vitro Strip] 400 strip 3    Sig: TEST BLOOD SUGAR FOUR TIMES DAILY (BEFORE MEALS AND AT BEDTIME)     Endocrinology: Diabetes - Testing Supplies Failed - 04/20/2023  2:41 AM      Failed - Valid encounter within last 12 months    Recent Outpatient Visits           1 year ago Compression fracture of L2 vertebra, initial encounter (HCC)   [facility] Donita Brooks, MD   1 year ago Acute midline low back pain without sciatica   [facility] Tanya Nones Priscille Heidelberg, MD   1 year ago Strain of lumbar region, initial encounter   [facility] Donita Brooks, MD   2 years ago Chronic left shoulder pain   [facility] Donita Brooks, MD   2 years ago Atrial fibrillation, unspecified type [facility])   [facility] Pickard, Priscille Heidelberg, MD

## 2023-06-02 ENCOUNTER — Other Ambulatory Visit: Payer: Self-pay | Admitting: Family Medicine

## 2023-07-05 ENCOUNTER — Other Ambulatory Visit: Payer: Self-pay | Admitting: Family Medicine

## 2023-10-16 ENCOUNTER — Encounter (INDEPENDENT_AMBULATORY_CARE_PROVIDER_SITE_OTHER): Payer: Self-pay

## 2023-11-06 ENCOUNTER — Other Ambulatory Visit: Payer: Self-pay | Admitting: Family Medicine

## 2023-11-06 DIAGNOSIS — G894 Chronic pain syndrome: Secondary | ICD-10-CM

## 2023-11-06 NOTE — Telephone Encounter (Unsigned)
 Copied from CRM [id number]. Topic: Clinical - Medication Refill >> Nov 06, 2023 11:52 AM Antwanette L wrote: Medication: HYDROcodone -acetaminophen  (NORCO/VICODIN) 5-325 MG tablet  Has the patient contacted their pharmacy? No   This is the patient's preferred pharmacy:  [facility] [place], [place] - [address] [place] [address] Phone: [phone] Fax: [phone]  Is this the correct pharmacy for this prescription? Yes    Has the prescription been filled recently? No. Last refilled on 01/02/23  Is the patient out of the medication? Yes  Has the patient been seen for an appointment in the last year OR does the patient have an upcoming appointment? Yes. Last ov was in 08/08/22 but patient has an upcoming appt on 11/23/23  Can we respond through MyChart? No. Contact patient by phone at [phone]  Agent: Please be advised that Rx refills may take up to 3 business days. We ask that you follow-up with your pharmacy.

## 2023-11-07 NOTE — Telephone Encounter (Signed)
 Requested medication (s) are due for refill today: yes  Requested medication (s) are on the active medication list: yes  Last refill:  01/02/23  Future visit scheduled: yes  Notes to clinic:  Unable to refill per protocol, cannot delegate.      Requested Prescriptions  Pending Prescriptions Disp Refills   HYDROcodone -acetaminophen  (NORCO/VICODIN) 5-325 MG tablet 90 tablet 0    Sig: Take 1 tablet by mouth every 6 (six) hours as needed for moderate pain (pain score 4-6).     Not Delegated - Analgesics:  Opioid Agonist Combinations Failed - 11/07/2023  2:10 PM      Failed - This refill cannot be delegated      Failed - Urine Drug Screen completed in last 360 days      Failed - Valid encounter within last 3 months    Recent Outpatient Visits           1 year ago Dry gangrene [facility])   [facility] Pickard, Cisco Crest, MD

## 2023-11-23 ENCOUNTER — Ambulatory Visit: Admitting: Family Medicine

## 2023-12-13 ENCOUNTER — Telehealth: Admitting: Family Medicine

## 2023-12-13 MED ORDER — ACCU-CHEK AVIVA PLUS VI STRP: ORAL_STRIP | 0 refills | Status: DC

## 2023-12-13 MED ORDER — ACCU-CHEK FASTCLIX LANCETS MISC
0 refills | Status: DC
Start: 2023-12-13 — End: 2024-03-19

## 2023-12-14 NOTE — Progress Notes (Signed)
 This encounter was created in error - please disregard.

## 2024-03-19 ENCOUNTER — Other Ambulatory Visit: Payer: Self-pay | Admitting: Family Medicine

## 2024-03-19 DIAGNOSIS — E119 Type 2 diabetes mellitus without complications: Secondary | ICD-10-CM

## 2024-04-17 ENCOUNTER — Other Ambulatory Visit: Payer: Self-pay | Admitting: Family Medicine

## 2024-04-17 DIAGNOSIS — E119 Type 2 diabetes mellitus without complications: Secondary | ICD-10-CM

## 2024-06-05 ENCOUNTER — Ambulatory Visit

## 2024-06-05 DIAGNOSIS — E119 Type 2 diabetes mellitus without complications: Secondary | ICD-10-CM | POA: Diagnosis not present

## 2024-06-05 DIAGNOSIS — I96 Gangrene, not elsewhere classified: Secondary | ICD-10-CM

## 2024-06-05 DIAGNOSIS — Z794 Long term (current) use of insulin: Secondary | ICD-10-CM | POA: Diagnosis not present

## 2024-06-05 DIAGNOSIS — I739 Peripheral vascular disease, unspecified: Secondary | ICD-10-CM

## 2024-06-05 MED ORDER — SULFAMETHOXAZOLE-TRIMETHOPRIM 800-160 MG PO TABS: 1.0000 | ORAL_TABLET | Freq: Two times a day (BID) | ORAL | 0 refills | Status: AC
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
## 2024-06-07 ENCOUNTER — Encounter: Payer: Self-pay | Admitting: Podiatry

## 2024-06-07 MED ORDER — DOXYCYCLINE HYCLATE 100 MG PO TABS: 100.0000 mg | ORAL_TABLET | Freq: Two times a day (BID) | ORAL | 0 refills | Status: DC

## 2024-06-09 ENCOUNTER — Other Ambulatory Visit (INDEPENDENT_AMBULATORY_CARE_PROVIDER_SITE_OTHER)

## 2024-06-09 ENCOUNTER — Other Ambulatory Visit (INDEPENDENT_AMBULATORY_CARE_PROVIDER_SITE_OTHER): Payer: Self-pay | Admitting: Vascular Surgery

## 2024-06-09 ENCOUNTER — Other Ambulatory Visit: Payer: Self-pay

## 2024-06-09 DIAGNOSIS — I739 Peripheral vascular disease, unspecified: Secondary | ICD-10-CM

## 2024-06-09 DIAGNOSIS — E119 Type 2 diabetes mellitus without complications: Secondary | ICD-10-CM

## 2024-06-09 DIAGNOSIS — I96 Gangrene, not elsewhere classified: Secondary | ICD-10-CM

## 2024-06-10 ENCOUNTER — Other Ambulatory Visit: Payer: Self-pay

## 2024-06-10 ENCOUNTER — Telehealth: Payer: Self-pay | Admitting: Lab

## 2024-06-10 ENCOUNTER — Telehealth: Payer: Self-pay

## 2024-06-10 ENCOUNTER — Ambulatory Visit: Admitting: Family Medicine

## 2024-06-10 VITALS — BP 144/62 | HR 76 | Ht 68.0 in | Wt 143.0 lb

## 2024-06-10 DIAGNOSIS — I10 Essential (primary) hypertension: Secondary | ICD-10-CM

## 2024-06-10 DIAGNOSIS — Z794 Long term (current) use of insulin: Secondary | ICD-10-CM

## 2024-06-10 DIAGNOSIS — A09 Infectious gastroenteritis and colitis, unspecified: Secondary | ICD-10-CM | POA: Diagnosis not present

## 2024-06-10 DIAGNOSIS — I739 Peripheral vascular disease, unspecified: Secondary | ICD-10-CM | POA: Diagnosis not present

## 2024-06-10 DIAGNOSIS — I96 Gangrene, not elsewhere classified: Secondary | ICD-10-CM

## 2024-06-10 DIAGNOSIS — E119 Type 2 diabetes mellitus without complications: Secondary | ICD-10-CM | POA: Diagnosis not present

## 2024-06-10 DIAGNOSIS — I4891 Unspecified atrial fibrillation: Secondary | ICD-10-CM

## 2024-06-10 DIAGNOSIS — E039 Hypothyroidism, unspecified: Secondary | ICD-10-CM | POA: Diagnosis not present

## 2024-06-10 LAB — COMPREHENSIVE METABOLIC PANEL WITH GFR
AG Ratio: 1.2 (calc) (ref 1.0–2.5)
ALT: 19 U/L (ref 9–46)
AST: 35 U/L (ref 10–35)
Albumin: 3.7 g/dL (ref 3.6–5.1)
Alkaline phosphatase (APISO): 63 U/L (ref 35–144)
BUN: 13 mg/dL (ref 7–25)
CO2: 24 mmol/L (ref 20–32)
Calcium: 9.4 mg/dL (ref 8.6–10.3)
Chloride: 95 mmol/L — ABNORMAL LOW (ref 98–110)
Creat: 1.25 mg/dL (ref 0.70–1.28)
Globulin: 3.2 g/dL (ref 1.9–3.7)
Glucose, Bld: 133 mg/dL — ABNORMAL HIGH (ref 65–99)
Potassium: 3.9 mmol/L (ref 3.5–5.3)
Sodium: 132 mmol/L — ABNORMAL LOW (ref 135–146)
Total Bilirubin: 0.5 mg/dL (ref 0.2–1.2)
Total Protein: 6.9 g/dL (ref 6.1–8.1)
eGFR: 59 mL/min/1.73m2 — ABNORMAL LOW

## 2024-06-10 LAB — CBC WITH DIFFERENTIAL/PLATELET
Absolute Lymphocytes: 1140 {cells}/uL (ref 850–3900)
Absolute Monocytes: 1332 {cells}/uL — ABNORMAL HIGH (ref 200–950)
Basophils Absolute: 30 {cells}/uL (ref 0–200)
Basophils Relative: 0.2 %
Eosinophils Absolute: 148 {cells}/uL (ref 15–500)
Eosinophils Relative: 1 %
HCT: 35.5 % — ABNORMAL LOW (ref 39.4–51.1)
Hemoglobin: 11.8 g/dL — ABNORMAL LOW (ref 13.2–17.1)
MCH: 31.6 pg (ref 27.0–33.0)
MCHC: 33.2 g/dL (ref 31.6–35.4)
MCV: 94.9 fL (ref 81.4–101.7)
MPV: 10.6 fL (ref 7.5–12.5)
Monocytes Relative: 9 %
Neutro Abs: 12151 {cells}/uL — ABNORMAL HIGH (ref 1500–7800)
Neutrophils Relative %: 82.1 %
Platelets: 359 Thousand/uL (ref 140–400)
RBC: 3.74 Million/uL — ABNORMAL LOW (ref 4.20–5.80)
RDW: 12.9 % (ref 11.0–15.0)
Total Lymphocyte: 7.7 %
WBC: 14.8 Thousand/uL — ABNORMAL HIGH (ref 3.8–10.8)

## 2024-06-10 LAB — LIPID PANEL
Cholesterol: 193 mg/dL
HDL: 51 mg/dL
LDL Cholesterol (Calc): 123 mg/dL — ABNORMAL HIGH
Non-HDL Cholesterol (Calc): 142 mg/dL — ABNORMAL HIGH
Total CHOL/HDL Ratio: 3.8 (calc)
Triglycerides: 87 mg/dL

## 2024-06-10 LAB — TSH: TSH: 44.8 m[IU]/L — ABNORMAL HIGH (ref 0.40–4.50)

## 2024-06-10 LAB — HEMOGLOBIN A1C
Hgb A1c MFr Bld: 8.4 % — ABNORMAL HIGH
Mean Plasma Glucose: 194 mg/dL
eAG (mmol/L): 10.8 mmol/L

## 2024-06-10 MED ORDER — APIXABAN 5 MG PO TABS: 5.0000 mg | ORAL_TABLET | Freq: Two times a day (BID) | ORAL | 11 refills | Status: DC

## 2024-06-10 MED ORDER — SILVER SULFADIAZINE 1 % EX CREA: 1.0000 | TOPICAL_CREAM | Freq: Every day | CUTANEOUS | 0 refills | Status: DC
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
## 2024-06-10 NOTE — Telephone Encounter (Signed)
 Spoke to Zachary Reed at length - discussed dressing changes in detail. She also reports that the patient cannot tolerate the doxycycline  - Uncontrollable bowel movements and made his confusion worse. She stopped giving him the doxy. Is this OK? And does he need to be on a different antibiotic?

## 2024-06-10 NOTE — Telephone Encounter (Signed)
 Patient daughter states patient visited PCP and patient states the iodine is burning foot also was told to contact us  for silverdine  1% prescription. Patient didn't start antibiotics due to potential side effects.Please advise.

## 2024-06-10 NOTE — Telephone Encounter (Unsigned)
 Copied from CRM #[id number]. Topic: Clinical - Prescription Issue >> Jun 10, 2024 10:05 AM Montie POUR wrote: Reason for CRM:  He cannot afford apixaban  (ELIQUIS ) 5 MG TABS tablet. It was going to be around $249.00 per month. Insurance will cover Plavix  (generic name: clopidogrel ). Please call Ginger, daughter,  at [phone] or go ahead and send in Plavix .

## 2024-06-11 ENCOUNTER — Other Ambulatory Visit: Payer: Self-pay

## 2024-06-11 ENCOUNTER — Ambulatory Visit (INDEPENDENT_AMBULATORY_CARE_PROVIDER_SITE_OTHER): Admitting: Nurse Practitioner

## 2024-06-11 ENCOUNTER — Telehealth: Payer: Self-pay

## 2024-06-11 DIAGNOSIS — I7025 Atherosclerosis of native arteries of other extremities with ulceration: Secondary | ICD-10-CM | POA: Insufficient documentation

## 2024-06-11 DIAGNOSIS — I4891 Unspecified atrial fibrillation: Secondary | ICD-10-CM

## 2024-06-11 DIAGNOSIS — I739 Peripheral vascular disease, unspecified: Secondary | ICD-10-CM

## 2024-06-11 MED ORDER — CLOPIDOGREL BISULFATE 75 MG PO TABS: 75.0000 mg | ORAL_TABLET | Freq: Every day | ORAL | 1 refills | Status: AC

## 2024-06-11 MED ORDER — CEPHALEXIN 500 MG PO CAPS: 500.0000 mg | ORAL_CAPSULE | Freq: Three times a day (TID) | ORAL | 0 refills | Status: AC

## 2024-06-11 NOTE — Addendum Note (Signed)
 Addended by: MAGDALEN BARTER on: 06/11/2024 10:55 AM   Modules accepted: Orders
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
## 2024-06-11 NOTE — Telephone Encounter (Signed)
 Patient's daughter contacted us  regarding doxycycline  causing diarrhea. He can stop doxycyline and is to switch to keflex  TID 500mg  x 7 days. Called into [facility].

## 2024-06-11 NOTE — Telephone Encounter (Signed)
 Pt is in need of a PA on Eliquis  5 mg. Thank you.

## 2024-06-12 ENCOUNTER — Ambulatory Visit: Payer: Self-pay | Admitting: Family Medicine

## 2024-06-12 ENCOUNTER — Other Ambulatory Visit (HOSPITAL_COMMUNITY): Payer: Self-pay

## 2024-06-12 ENCOUNTER — Encounter (INDEPENDENT_AMBULATORY_CARE_PROVIDER_SITE_OTHER): Payer: Self-pay | Admitting: Vascular Surgery

## 2024-06-12 ENCOUNTER — Ambulatory Visit (INDEPENDENT_AMBULATORY_CARE_PROVIDER_SITE_OTHER): Admitting: Vascular Surgery

## 2024-06-12 VITALS — BP 97/40 | HR 76

## 2024-06-12 DIAGNOSIS — E782 Mixed hyperlipidemia: Secondary | ICD-10-CM | POA: Diagnosis not present

## 2024-06-12 DIAGNOSIS — I10 Essential (primary) hypertension: Secondary | ICD-10-CM | POA: Diagnosis not present

## 2024-06-12 DIAGNOSIS — I7025 Atherosclerosis of native arteries of other extremities with ulceration: Secondary | ICD-10-CM | POA: Diagnosis not present

## 2024-06-12 DIAGNOSIS — Z794 Long term (current) use of insulin: Secondary | ICD-10-CM

## 2024-06-12 DIAGNOSIS — E119 Type 2 diabetes mellitus without complications: Secondary | ICD-10-CM

## 2024-06-12 DIAGNOSIS — I4891 Unspecified atrial fibrillation: Secondary | ICD-10-CM | POA: Diagnosis not present

## 2024-06-12 NOTE — Telephone Encounter (Signed)
 You're welcome.SABRASABRA

## 2024-06-12 NOTE — Telephone Encounter (Signed)
 Good morning, I test billed Mr. Rosendahl eliquis  5 mg and it went through his insurance successfully and his copay is high, it's $249.00

## 2024-06-17 ENCOUNTER — Telehealth (INDEPENDENT_AMBULATORY_CARE_PROVIDER_SITE_OTHER): Payer: Self-pay

## 2024-06-17 NOTE — Telephone Encounter (Signed)
 Call him and get some additional information.  That isn't ultrasound gel film.  Find out when this started

## 2024-06-17 NOTE — Telephone Encounter (Signed)
 Spoke with the patient's daughter and he is scheduled for a right leg angio w/ intervention on 06/24/24 with a 8:30 am arrival time to the [facility]. Pre-procedure instructions were discussed and will be sent to Mychart and mailed.

## 2024-06-18 NOTE — Telephone Encounter (Signed)
 Patient daughter stated that she notice the area on the right thigh on that Friday of the week he came in for ultrasound.Area had not been washed until Friday. When they pulled his pants up on appt date there was gel still on the left leg. She has used soothing creams and warm cloth with dawn to clean the area but no relief.

## 2024-06-19 ENCOUNTER — Ambulatory Visit

## 2024-06-24 ENCOUNTER — Ambulatory Visit
Admission: RE | Admit: 2024-06-24 | Discharge: 2024-06-24 | Disposition: A | Discharge status: Home / Self Care | Attending: Vascular Surgery | Admitting: Vascular Surgery

## 2024-06-24 ENCOUNTER — Telehealth: Payer: Self-pay | Admitting: Family Medicine

## 2024-06-24 ENCOUNTER — Encounter
Admission: RE | Disposition: A | Discharge status: Home / Self Care | Payer: Self-pay | Source: Home / Self Care | Attending: Vascular Surgery

## 2024-06-24 ENCOUNTER — Other Ambulatory Visit: Payer: Self-pay

## 2024-06-24 ENCOUNTER — Ambulatory Visit: Admitting: Anesthesiology

## 2024-06-24 ENCOUNTER — Encounter: Payer: Self-pay | Admitting: Vascular Surgery

## 2024-06-24 ENCOUNTER — Other Ambulatory Visit: Payer: Self-pay | Admitting: Family Medicine

## 2024-06-24 DIAGNOSIS — Z7901 Long term (current) use of anticoagulants: Secondary | ICD-10-CM | POA: Diagnosis not present

## 2024-06-24 DIAGNOSIS — I10 Essential (primary) hypertension: Secondary | ICD-10-CM | POA: Insufficient documentation

## 2024-06-24 DIAGNOSIS — I70235 Atherosclerosis of native arteries of right leg with ulceration of other part of foot: Secondary | ICD-10-CM | POA: Diagnosis not present

## 2024-06-24 DIAGNOSIS — I4891 Unspecified atrial fibrillation: Secondary | ICD-10-CM | POA: Insufficient documentation

## 2024-06-24 DIAGNOSIS — E1151 Type 2 diabetes mellitus with diabetic peripheral angiopathy without gangrene: Secondary | ICD-10-CM | POA: Diagnosis present

## 2024-06-24 DIAGNOSIS — E782 Mixed hyperlipidemia: Secondary | ICD-10-CM | POA: Diagnosis not present

## 2024-06-24 DIAGNOSIS — Z87891 Personal history of nicotine dependence: Secondary | ICD-10-CM | POA: Insufficient documentation

## 2024-06-24 DIAGNOSIS — Z79899 Other long term (current) drug therapy: Secondary | ICD-10-CM | POA: Insufficient documentation

## 2024-06-24 DIAGNOSIS — E11621 Type 2 diabetes mellitus with foot ulcer: Secondary | ICD-10-CM | POA: Insufficient documentation

## 2024-06-24 DIAGNOSIS — I70299 Other atherosclerosis of native arteries of extremities, unspecified extremity: Secondary | ICD-10-CM

## 2024-06-24 DIAGNOSIS — Z7984 Long term (current) use of oral hypoglycemic drugs: Secondary | ICD-10-CM | POA: Insufficient documentation

## 2024-06-24 DIAGNOSIS — L97519 Non-pressure chronic ulcer of other part of right foot with unspecified severity: Secondary | ICD-10-CM | POA: Insufficient documentation

## 2024-06-24 DIAGNOSIS — Z91013 Allergy to seafood: Secondary | ICD-10-CM | POA: Diagnosis not present

## 2024-06-24 LAB — CREATININE, SERUM
Creatinine, Ser: 1.27 mg/dL — ABNORMAL HIGH (ref 0.61–1.24)
GFR, Estimated: 58 mL/min — ABNORMAL LOW

## 2024-06-24 LAB — BUN: BUN: 13 mg/dL (ref 8–23)

## 2024-06-24 LAB — GLUCOSE, CAPILLARY
Glucose-Capillary: 128 mg/dL — ABNORMAL HIGH (ref 70–99)
Glucose-Capillary: 128 mg/dL — ABNORMAL HIGH (ref 70–99)

## 2024-06-24 MED ORDER — CEFAZOLIN SODIUM-DEXTROSE 2-4 GM/100ML-% IV SOLN
INTRAVENOUS | Status: AC
  Filled 2024-06-24: qty 100

## 2024-06-24 MED ORDER — ONDANSETRON HCL 4 MG/2ML IJ SOLN: 4.0000 mg | Freq: Four times a day (QID) | INTRAMUSCULAR | Status: DC | PRN

## 2024-06-24 MED ORDER — METHYLPREDNISOLONE SODIUM SUCC 125 MG IJ SOLR
125.0000 mg | Freq: Once | INTRAMUSCULAR | Status: AC | PRN
  Administered 2024-06-24: 125 mg via INTRAVENOUS

## 2024-06-24 MED ORDER — METHYLPREDNISOLONE SODIUM SUCC 125 MG IJ SOLR
INTRAMUSCULAR | Status: AC
  Filled 2024-06-24: qty 2

## 2024-06-24 MED ORDER — MIDAZOLAM HCL (PF) 2 MG/2ML IJ SOLN
INTRAMUSCULAR | Status: DC | PRN
  Administered 2024-06-24: 1 mg via INTRAVENOUS

## 2024-06-24 MED ORDER — PROPOFOL 500 MG/50ML IV EMUL
INTRAVENOUS | Status: DC | PRN
  Administered 2024-06-24: 50 ug/kg/min via INTRAVENOUS

## 2024-06-24 MED ORDER — PROPOFOL 1000 MG/100ML IV EMUL
INTRAVENOUS | Status: AC
  Filled 2024-06-24: qty 100

## 2024-06-24 MED ORDER — HYDROMORPHONE HCL 1 MG/ML IJ SOLN: 1.0000 mg | Freq: Once | INTRAMUSCULAR | Status: DC | PRN

## 2024-06-24 MED ORDER — HYDRALAZINE HCL 20 MG/ML IJ SOLN: 5.0000 mg | INTRAMUSCULAR | Status: DC | PRN

## 2024-06-24 MED ORDER — FENTANYL CITRATE (PF) 100 MCG/2ML IJ SOLN
INTRAMUSCULAR | Status: DC | PRN
  Administered 2024-06-24: 25 ug via INTRAVENOUS
  Administered 2024-06-24: 50 ug via INTRAVENOUS
  Administered 2024-06-24: 25 ug via INTRAVENOUS

## 2024-06-24 MED ORDER — OXYCODONE HCL 5 MG/5ML PO SOLN: 5.0000 mg | Freq: Once | ORAL | Status: DC | PRN

## 2024-06-24 MED ORDER — DROPERIDOL 2.5 MG/ML IJ SOLN: 0.6250 mg | Freq: Once | INTRAMUSCULAR | Status: DC | PRN

## 2024-06-24 MED ORDER — FAMOTIDINE 20 MG PO TABS
40.0000 mg | ORAL_TABLET | Freq: Once | ORAL | Status: AC | PRN
  Administered 2024-06-24: 40 mg via ORAL

## 2024-06-24 MED ORDER — OXYCODONE HCL 5 MG PO TABS: 5.0000 mg | ORAL_TABLET | Freq: Once | ORAL | Status: DC | PRN

## 2024-06-24 MED ORDER — PHENYLEPHRINE HCL-NACL 20-0.9 MG/250ML-% IV SOLN
INTRAVENOUS | Status: DC | PRN
  Administered 2024-06-24: 10 ug/min via INTRAVENOUS

## 2024-06-24 MED ORDER — SODIUM CHLORIDE 0.9 % IV SOLN: 250.0000 mL | INTRAVENOUS | Status: DC | PRN

## 2024-06-24 MED ORDER — METOPROLOL TARTRATE 5 MG/5ML IV SOLN
INTRAVENOUS | Status: DC | PRN
  Administered 2024-06-24: 2 mg via INTRAVENOUS

## 2024-06-24 MED ORDER — ACETAMINOPHEN 325 MG PO TABS: 650.0000 mg | ORAL_TABLET | ORAL | Status: DC | PRN

## 2024-06-24 MED ORDER — LIDOCAINE HCL (PF) 1 % IJ SOLN
INTRAMUSCULAR | Status: DC | PRN
  Administered 2024-06-24: 10 mL

## 2024-06-24 MED ORDER — SODIUM CHLORIDE 0.9% FLUSH: 3.0000 mL | Freq: Two times a day (BID) | INTRAVENOUS | Status: DC

## 2024-06-24 MED ORDER — FENTANYL CITRATE (PF) 100 MCG/2ML IJ SOLN: 25.0000 ug | INTRAMUSCULAR | Status: DC | PRN

## 2024-06-24 MED ORDER — MORPHINE SULFATE (PF) 2 MG/ML IV SOLN: 2.0000 mg | INTRAVENOUS | Status: DC | PRN

## 2024-06-24 MED ORDER — IODIXANOL 320 MG/ML IV SOLN
INTRAVENOUS | Status: DC | PRN
  Administered 2024-06-24: 50 mL

## 2024-06-24 MED ORDER — MIDAZOLAM HCL 2 MG/ML PO SYRP: 8.0000 mg | ORAL_SOLUTION | Freq: Once | ORAL | Status: DC | PRN

## 2024-06-24 MED ORDER — DEXMEDETOMIDINE HCL IN NACL 400 MCG/100ML IV SOLN
INTRAVENOUS | Status: DC | PRN
  Administered 2024-06-24 (×3): 4 ug via INTRAVENOUS

## 2024-06-24 MED ORDER — SILVER SULFADIAZINE 1 % EX CREA: 1.0000 | TOPICAL_CREAM | Freq: Every day | CUTANEOUS | 0 refills | Status: AC

## 2024-06-24 MED ORDER — OXYCODONE HCL 5 MG PO TABS: 5.0000 mg | ORAL_TABLET | ORAL | Status: DC | PRN

## 2024-06-24 MED ORDER — FAMOTIDINE 20 MG PO TABS
ORAL_TABLET | ORAL | Status: AC
  Filled 2024-06-24: qty 2

## 2024-06-24 MED ORDER — HEPARIN (PORCINE) IN NACL 1000-0.9 UT/500ML-% IV SOLN
INTRAVENOUS | Status: DC | PRN
  Administered 2024-06-24: 1000 mL

## 2024-06-24 MED ORDER — SODIUM CHLORIDE 0.9 % IV SOLN: INTRAVENOUS | Status: DC

## 2024-06-24 MED ORDER — SODIUM CHLORIDE 0.9% FLUSH: 3.0000 mL | INTRAVENOUS | Status: DC | PRN

## 2024-06-24 MED ORDER — DIPHENHYDRAMINE HCL 50 MG/ML IJ SOLN
INTRAMUSCULAR | Status: AC
  Filled 2024-06-24: qty 1

## 2024-06-24 MED ORDER — CEFAZOLIN SODIUM-DEXTROSE 2-4 GM/100ML-% IV SOLN
2.0000 g | INTRAVENOUS | Status: AC
  Administered 2024-06-24: 2 g via INTRAVENOUS

## 2024-06-24 MED ORDER — DILTIAZEM HCL ER COATED BEADS 120 MG PO CP24: 120.0000 mg | ORAL_CAPSULE | Freq: Every day | ORAL | 1 refills | Status: AC

## 2024-06-24 MED ORDER — MIDAZOLAM HCL 2 MG/2ML IJ SOLN
INTRAMUSCULAR | Status: AC
  Filled 2024-06-24: qty 2

## 2024-06-24 MED ORDER — PHENYLEPHRINE 80 MCG/ML (10ML) SYRINGE FOR IV PUSH (FOR BLOOD PRESSURE SUPPORT)
PREFILLED_SYRINGE | INTRAVENOUS | Status: DC | PRN
  Administered 2024-06-24: 80 ug via INTRAVENOUS

## 2024-06-24 MED ORDER — METOPROLOL TARTRATE 5 MG/5ML IV SOLN
INTRAVENOUS | Status: AC
  Filled 2024-06-24: qty 5

## 2024-06-24 MED ORDER — FENTANYL CITRATE (PF) 100 MCG/2ML IJ SOLN
INTRAMUSCULAR | Status: AC
  Filled 2024-06-24: qty 2

## 2024-06-24 MED ORDER — LABETALOL HCL 5 MG/ML IV SOLN: 10.0000 mg | INTRAVENOUS | Status: DC | PRN

## 2024-06-24 MED ORDER — DIPHENHYDRAMINE HCL 50 MG/ML IJ SOLN
50.0000 mg | Freq: Once | INTRAMUSCULAR | Status: AC | PRN
  Administered 2024-06-24: 50 mg via INTRAVENOUS

## 2024-06-24 MED ORDER — PHENYLEPHRINE HCL-NACL 20-0.9 MG/250ML-% IV SOLN
INTRAVENOUS | Status: AC
  Filled 2024-06-24: qty 250

## 2024-06-24 MED ORDER — ACETAMINOPHEN 10 MG/ML IV SOLN: 1000.0000 mg | Freq: Once | INTRAVENOUS | Status: DC | PRN

## 2024-06-24 NOTE — Transfer of Care (Signed)
 Immediate Anesthesia Transfer of Care Note  Patient: Zachary Reed.  Procedure(s) Performed: Lower Extremity Angiography (Right: Leg Lower) LOWER EXTREMITY INTERVENTION (Right: Leg Lower)  Patient Location: PACU  Anesthesia Type:MAC  Level of Consciousness: sedated and responds to stimulation  Airway & Oxygen Therapy: Patient Spontanous Breathing and Patient connected to face mask oxygen  Post-op Assessment: Report given to RN and Post -op Vital signs reviewed and stable  Post vital signs: stable  Last Vitals:  Vitals Value Taken Time  BP    Temp    Pulse    Resp    SpO2      Last Pain:  Vitals:   06/24/24 0849  TempSrc: Oral  PainSc: 0-No pain         Complications: No notable events documented.

## 2024-06-24 NOTE — Op Note (Signed)
 [facility]  Percutaneous Study/Intervention Procedural Note   Date of Surgery: 06/24/2024,1:08 PM  Surgeon:Loki Wuthrich, Zachary Reed   Pre-operative Diagnosis: Atherosclerotic occlusive disease bilateral lower extremities with multiple ulcerations of the right foot and toes  Post-operative diagnosis:  Same  Procedure(s) Performed:  1.  Abdominal aortogram  2.  Selective injection of the third lower extremity third order catheter placement  3.  Ultrasound-guided access to the left common femoral artery  4.  StarClose left femoral artery    Anesthesia: General anesthesia  Sheath: 5 French 11 cm Pinnacle sheath retrograde left common femoral  Contrast: 50 cc   Fluoroscopy Time: 7.4 minutes  Indications:  The patient presents to [facility] with atherosclerotic occlusive disease bilateral lower extremities with multiple ulcerations of both feet.  Pedal pulses are nonpalpable bilaterally suggesting hemodynamically significant atherosclerotic occlusive disease.  The risks and benefits as well as alternative therapies for lower extremity revascularization are reviewed with the patient all questions are answered the patient agrees to proceed.  The patient is therefore undergoing angiography with the hope for intervention for limb salvage.   Procedure:  Zachary Reedis a 79 y.o. male who was identified and appropriate procedural time out was performed.  The patient was then placed supine on the table and prepped and draped in the usual sterile fashion.  Ultrasound was used to evaluate the left common femoral artery.  It was echolucent and pulsatile indicating it is patent .  An ultrasound image was acquired for the permanent record.  A micropuncture needle was used to access the left common femoral artery under direct ultrasound guidance.  The microwire was then advanced under fluoroscopic guidance without difficulty followed by the micro-sheath.  A 0.035 J wire was  advanced without resistance and a 5Fr sheath was placed.    Pigtail catheter was then advanced to the level of T12 and AP projection of the aorta was obtained. Pigtail catheter was then repositioned to above the bifurcation and LAO view of the pelvis was obtained. Stiff angled Glidewire and pigtail catheter was then used across the bifurcation and the catheter was positioned in the distal external iliac artery.  RAO of the right groin was then obtained. Wire was reintroduced and negotiated into the SFA and the catheter was advanced into the SFA. Distal runoff was then performed.  Wire was then reintroduced and the catheter was negotiated down to the tibioperoneal trunk where again distal runoff was performed.  This represents third order catheter placement  After review of the images the catheter was removed over wire and an LAO view of the groin was obtained. StarClose device was deployed without difficulty.   Findings:   Aortogram: The abdominal aorta is opacified with a bolus injection contrast.  Single renal arteries are noted bilaterally with normal nephrograms.  No evidence of hemodynamically significant renal artery stenosis.  There are no hemodynamically significant stenoses identified within the aorta.  The aortic bifurcation is mildly diseased but widely patent.  Bilateral common internal and external iliac arteries are free of hemodynamically significant lesions.  Right lower Extremity: The right common femoral artery demonstrates a short segment occlusion with profound coral reef like eccentric plaque from just distal to the circumflex vessels into the origin of the SFA.  The profunda femoris artery has a greater than 90% stenosis at its origin is patent distal to this lesion but diffusely diseased with minimal collaterals distally.  The superficial femoral artery demonstrates diffuse disease but no hemodynamically significant lesions  distal to the ostial stenosis and the popliteal artery  demonstrates multiple 60 to 70% stenoses from the level of the femoral condyles to the below-knee segment.  The anterior tibial artery is occluded throughout its entire course.  The tibioperoneal trunk is patent but diffusely diseased with 60 to 70% stenoses present in its proximal two thirds.  In the distal tibioperoneal trunk at the level of the bifurcation there is an occlusion.  The posterior tibial does not have any meaningful reconstitution essentially it is occluded throughout its entire course.  The peroneal is reconstituted distal to this occlusion but demonstrates numerous greater than 80% stenoses down to the ankle.  I am unable to visualize what collateral filling crosses the foot.                  Left lower Extremity: The left common femoral is very similar to the right with a longer segment occlusion and profound eccentric plaque throughout the entire length of the common femoral artery.  There is a string sign at the origin of the profunda femoris.  SFA appears to be patent distal to its ostial stenosis.     SUMMARY: Based on these images no intervention is performed at this time.  Under better circumstances the patient would undergo femoral endarterectomy with treatment of the peroneal artery occlusion and popliteal stenoses.  The peroneal artery proper could be assessed at that time.  Given the patient's continued movement imaging from the level of the distal peroneal down to the forefoot was unattainable but if the patient were under general anesthesia and paralyzed then imaging would be obtainable.  However, given his overall health I am not sure that he is a surgical candidate.  This is complicated by the only other alternative would be a high above-knee amputation which would also require general anesthesia.    Disposition: Patient was taken to the recovery room in stable condition having tolerated the procedure well.  Zachary Reed 06/24/2024,1:08 PM

## 2024-06-24 NOTE — Progress Notes (Signed)
 Patient has been evaluated by anesthesia and is noted to be in atrial fibrillation with a heart rate between 100 3150.  The family has been told the patient needs antiarrhythmics in the past but has refused.  This is unfortunately not a new problem.  Given the severity of the ischemic changes to his foot I do not see working up his A-fib with RVR when the family has been resistant to any treatment in the past.  I will declare this an emergency based on limb salvage and have asked that we move forward with his angiogram

## 2024-06-24 NOTE — Progress Notes (Signed)
 Patient was premedicated for a shellfish allergy. After receiving the medication, patient became restless. Patient is still alert and answers most questions appropriately, as before. Family says he gets restless at night. Provider notified and decided to change procedure plans.

## 2024-06-24 NOTE — Telephone Encounter (Unsigned)
 Copied from CRM #[id number]. Topic: Clinical - Medication Refill >> Jun 24, 2024  3:18 PM Amber H wrote: Medication: diltiazem  (CARDIZEM  CD) 120 MG 24 hr capsule   Has the patient contacted their pharmacy? No, no refills  (Agent: If no, request that the patient contact the pharmacy for the refill. If patient does not wish to contact the pharmacy document the reason why and proceed with request.) (Agent: If yes, when and what did the pharmacy advise?)  This is the patient's preferred pharmacy:  [facility] - [place], [place] - [address] [place] [address] Phone: [phone] Fax: [phone]  Is this the correct pharmacy for this prescription? Yes If no, delete pharmacy and type the correct one.   Has the prescription been filled recently? No  Is the patient out of the medication? Yes  Has the patient been seen for an appointment in the last year OR does the patient have an upcoming appointment? Yes, 06/10/2024  Can we respond through MyChart? Yes  Agent: Please be advised that Rx refills may take up to 3 business days. We ask that you follow-up with your pharmacy. >> Jun 24, 2024  3:46 PM Zebedee SAUNDERS wrote: Pt's daughter Kendell Antu [phone] stated Vascular and anesthesiologist said for pt to be on diltiazem  (CARDIZEM  CD) 120 MG 24 hr capsule immediately.  >> Jun 24, 2024  3:23 PM Amber H wrote: I could no longer hear Tabitha to give her the turn around on med refills so phone called was ended. I attempted to call her back but she did not answer.

## 2024-06-24 NOTE — Progress Notes (Signed)
 The patient was premedicated for his shellfish allergy and after receiving his Benadryl  he has become much more fidgety and is incapable of holding still.  Given the need for the patient to be comfortable and not moving during an angiogram I have requested anesthesia to assist.  We will plan to proceed at approximately 11 AM today.

## 2024-06-24 NOTE — Interval H&P Note (Signed)
 History and Physical Interval Note:  06/24/2024 9:48 AM  Zachary Reed.  has presented today for surgery, with the diagnosis of RLE Angio w intervention   ASO w ulceration.  The various methods of treatment have been discussed with the patient and family. After consideration of risks, benefits and other options for treatment, the patient has consented to  Procedures: Lower Extremity Angiography (Right) LOWER EXTREMITY INTERVENTION (Right) as a surgical intervention.  The patient's history has been reviewed, patient examined, no change in status, stable for surgery.  I have reviewed the patient's chart and labs.  Questions were answered to the patient's satisfaction.     Cordella Shawl
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
## 2024-06-24 NOTE — Telephone Encounter (Signed)
 Copied from CRM #[id number]. Topic: Clinical - Medication Refill >> Jun 24, 2024  3:18 PM Amber H wrote: Medication: diltiazem  (CARDIZEM  CD) 120 MG 24 hr capsule   Has the patient contacted their pharmacy? No, no refills  (Agent: If no, request that the patient contact the pharmacy for the refill. If patient does not wish to contact the pharmacy document the reason why and proceed with request.) (Agent: If yes, when and what did the pharmacy advise?)  This is the patient's preferred pharmacy:  [facility] - [place], [place] - [address] [place] [address] Phone: [phone] Fax: [phone]  Is this the correct pharmacy for this prescription? Yes If no, delete pharmacy and type the correct one.   Has the prescription been filled recently? No  Is the patient out of the medication? Yes  Has the patient been seen for an appointment in the last year OR does the patient have an upcoming appointment? Yes, 06/10/2024  Can we respond through MyChart? Yes  Agent: Please be advised that Rx refills may take up to 3 business days. We ask that you follow-up with your pharmacy.

## 2024-06-25 ENCOUNTER — Encounter: Payer: Self-pay | Admitting: Vascular Surgery

## 2024-06-25 NOTE — Telephone Encounter (Signed)
 Duplicate request, refilled 06/24/24.  Requested Prescriptions  Pending Prescriptions Disp Refills   diltiazem  (CARDIZEM  CD) 120 MG 24 hr capsule 30 capsule 0    Sig: Take 1 capsule (120 mg total) by mouth daily.     Cardiovascular: Calcium Channel Blockers 3 Failed - 06/25/2024  2:10 PM      Failed - Cr in normal range and within 360 days    Creat  Date Value Ref Range Status  06/10/2024 1.25 0.70 - 1.28 mg/dL Final   Creatinine, Ser  Date Value Ref Range Status  06/24/2024 1.27 (H) 0.61 - 1.24 mg/dL Final   Creatinine, Urine  Date Value Ref Range Status  08/24/2020 113 20 - 320 mg/dL Final         Failed - Last Heart Rate in normal range    Pulse Readings from Last 1 Encounters:  06/24/24 (!) 127         Passed - ALT in normal range and within 360 days    ALT  Date Value Ref Range Status  06/10/2024 19 9 - 46 U/L Final         Passed - AST in normal range and within 360 days    AST  Date Value Ref Range Status  06/10/2024 35 10 - 35 U/L Final         Passed - Last BP in normal range    BP Readings from Last 1 Encounters:  06/24/24 138/89         Passed - Valid encounter within last 6 months    Recent Outpatient Visits           2 weeks ago Diabetes mellitus type 2, insulin  dependent [facility])   [place] [facility] Duanne Butler DASEN, MD   1 year ago Dry gangrene [facility])   [place] [facility] Pickard, Butler DASEN, MD

## 2024-06-27 ENCOUNTER — Other Ambulatory Visit: Payer: Self-pay | Admitting: Family Medicine

## 2024-07-02 ENCOUNTER — Ambulatory Visit
